# Patient Record
Sex: Female | Born: 1945 | Race: White | Hispanic: No
Health system: Midwestern US, Community
[De-identification: ages and names within clinical notes are randomized; demographics above are authoritative.]

## PROBLEM LIST (undated history)

## (undated) DIAGNOSIS — R0602 Shortness of breath: Secondary | ICD-10-CM

## (undated) DIAGNOSIS — R1031 Right lower quadrant pain: Secondary | ICD-10-CM

## (undated) DIAGNOSIS — N764 Abscess of vulva: Secondary | ICD-10-CM

## (undated) DIAGNOSIS — L920 Granuloma annulare: Secondary | ICD-10-CM

## (undated) DIAGNOSIS — IMO0002 Reserved for concepts with insufficient information to code with codable children: Secondary | ICD-10-CM

## (undated) DIAGNOSIS — G629 Polyneuropathy, unspecified: Secondary | ICD-10-CM

## (undated) DIAGNOSIS — N816 Rectocele: Secondary | ICD-10-CM

## (undated) DIAGNOSIS — E785 Hyperlipidemia, unspecified: Secondary | ICD-10-CM

## (undated) DIAGNOSIS — Z8711 Personal history of peptic ulcer disease: Secondary | ICD-10-CM

## (undated) DIAGNOSIS — Z8719 Personal history of other diseases of the digestive system: Secondary | ICD-10-CM

## (undated) DIAGNOSIS — N951 Menopausal and female climacteric states: Secondary | ICD-10-CM

## (undated) DIAGNOSIS — M545 Low back pain, unspecified: Secondary | ICD-10-CM

## (undated) DIAGNOSIS — R9389 Abnormal findings on diagnostic imaging of other specified body structures: Secondary | ICD-10-CM

## (undated) DIAGNOSIS — F419 Anxiety disorder, unspecified: Secondary | ICD-10-CM

## (undated) DIAGNOSIS — T7840XA Allergy, unspecified, initial encounter: Secondary | ICD-10-CM

## (undated) DIAGNOSIS — J189 Pneumonia, unspecified organism: Principal | ICD-10-CM

## (undated) DIAGNOSIS — Z01818 Encounter for other preprocedural examination: Secondary | ICD-10-CM

## (undated) DIAGNOSIS — K862 Cyst of pancreas: Secondary | ICD-10-CM

## (undated) DIAGNOSIS — Z1231 Encounter for screening mammogram for malignant neoplasm of breast: Principal | ICD-10-CM

## (undated) DIAGNOSIS — R109 Unspecified abdominal pain: Secondary | ICD-10-CM

## (undated) DIAGNOSIS — Z1211 Encounter for screening for malignant neoplasm of colon: Secondary | ICD-10-CM

## (undated) DIAGNOSIS — G959 Disease of spinal cord, unspecified: Secondary | ICD-10-CM

## (undated) DIAGNOSIS — M62838 Other muscle spasm: Secondary | ICD-10-CM

## (undated) DIAGNOSIS — E041 Nontoxic single thyroid nodule: Secondary | ICD-10-CM

## (undated) DIAGNOSIS — R42 Dizziness and giddiness: Secondary | ICD-10-CM

## (undated) DIAGNOSIS — K869 Disease of pancreas, unspecified: Principal | ICD-10-CM

## (undated) DIAGNOSIS — M4802 Spinal stenosis, cervical region: Secondary | ICD-10-CM

## (undated) DIAGNOSIS — R590 Localized enlarged lymph nodes: Secondary | ICD-10-CM

## (undated) DIAGNOSIS — D136 Benign neoplasm of pancreas: Secondary | ICD-10-CM

## (undated) HISTORY — PX: OTHER SURGICAL HISTORY: SHX169

## (undated) HISTORY — DX: Right lower quadrant pain: R10.31

## (undated) HISTORY — DX: Menopausal and female climacteric states: N95.1

## (undated) HISTORY — DX: Personal history of other diseases of the digestive system: Z87.19

## (undated) HISTORY — DX: Abnormal findings on diagnostic imaging of other specified body structures: R93.89

## (undated) HISTORY — DX: Granuloma annulare: L92.0

## (undated) HISTORY — DX: Reserved for concepts with insufficient information to code with codable children: IMO0002

## (undated) HISTORY — DX: Shortness of breath: R06.02

## (undated) HISTORY — PX: FOOT SURGERY: SHX648

## (undated) HISTORY — DX: Personal history of peptic ulcer disease: Z87.11

## (undated) HISTORY — DX: Allergy, unspecified, initial encounter: T78.40XA

## (undated) HISTORY — DX: Hyperlipidemia, unspecified: E78.5

## (undated) HISTORY — DX: Abscess of vulva: N76.4

## (undated) HISTORY — DX: Polyneuropathy, unspecified: G62.9

## (undated) HISTORY — PX: TUBAL LIGATION: SHX77

## (undated) HISTORY — DX: Low back pain: M54.5

## (undated) HISTORY — DX: Anxiety disorder, unspecified: F41.9

## (undated) HISTORY — DX: Rectocele: N81.6

---

## 2007-06-28 ENCOUNTER — Other Ambulatory Visit: Admission: RE | Admit: 2007-06-28 | Discharge: 2007-06-28 | Payer: Self-pay | Admitting: Obstetrics and Gynecology

## 2008-07-18 ENCOUNTER — Other Ambulatory Visit: Admission: RE | Admit: 2008-07-18 | Discharge: 2008-07-18 | Payer: Self-pay | Admitting: Obstetrics and Gynecology

## 2009-10-30 ENCOUNTER — Other Ambulatory Visit: Admission: RE | Admit: 2009-10-30 | Discharge: 2009-10-30 | Payer: Self-pay | Admitting: Obstetrics & Gynecology

## 2010-02-11 ENCOUNTER — Ambulatory Visit
Admission: RE | Admit: 2010-02-11 | Discharge: 2010-02-11 | Payer: Self-pay | Source: Home / Self Care | Attending: Internal Medicine | Admitting: Internal Medicine

## 2010-03-19 ENCOUNTER — Ambulatory Visit (HOSPITAL_COMMUNITY)
Admission: RE | Admit: 2010-03-19 | Discharge: 2010-03-19 | Disposition: A | Discharge status: Home / Self Care | Payer: Medicare Other | Source: Ambulatory Visit | Attending: Internal Medicine | Admitting: Internal Medicine

## 2010-03-19 ENCOUNTER — Encounter (HOSPITAL_BASED_OUTPATIENT_CLINIC_OR_DEPARTMENT_OTHER): Payer: Medicare Other | Admitting: Internal Medicine

## 2010-03-19 DIAGNOSIS — Z860100 Personal history of colon polyps, unspecified: Secondary | ICD-10-CM | POA: Insufficient documentation

## 2010-03-19 DIAGNOSIS — Z1211 Encounter for screening for malignant neoplasm of colon: Secondary | ICD-10-CM

## 2010-03-19 DIAGNOSIS — Z800 Family history of malignant neoplasm of digestive organs: Secondary | ICD-10-CM

## 2010-03-19 DIAGNOSIS — Z09 Encounter for follow-up examination after completed treatment for conditions other than malignant neoplasm: Secondary | ICD-10-CM | POA: Insufficient documentation

## 2010-04-12 NOTE — Op Note (Signed)
  Lydia Lara, Lydia Lara               ACCOUNT NO.:  [id number]  MEDICAL RECORD NO.:  [id number]           PATIENT TYPE:  O  LOCATION:  DAYP                          FACILITY:  [facility]  PHYSICIAN:  Lionel December, M.D.    DATE OF BIRTH:  22-Nov-1945  DATE OF PROCEDURE:  03/19/2010 DATE OF DISCHARGE:                              OPERATIVE REPORT   PROCEDURE:  Colonoscopy.  INDICATION:  Lydia Lara is 65 year old Caucasian female who has history of colonic polyps whose last exam was over 5 years ago in [place].  While the last exam was negative, she has had polyps on prior exams.  Family history is positive for colon carcinoma in a mother who died of metastatic disease at 46.  She was diagnosed possibly a year or two earlier.  Procedures were reviewed with the patient.  Informed consent was obtained.  MEDS FOR CONSCIOUS SEDATION:  Demerol 25 mg IV, Versed 4 mg IV in divided dose.  FINDINGS:  Procedure performed in endoscopy suite.  The patient's vital signs and O2 sat were monitored during the procedure and remained stable.  The patient was placed in left lateral recumbent position and rectal examination performed.  No abnormality noted on external or digital exam.  Pentax videoscope was placed through rectum and advanced under vision into sigmoid colon and beyond.  Preparation was excellent. Scope was passed into cecum which was identified by appendiceal orifice and ileocecal valve.  As the scope was withdrawn, colonic mucosa was carefully examined and was normal throughout.  Rectal mucosa similarly was normal.  Scope was retroflexed to examine anorectal junction which was unremarkable.  Endoscope was then withdrawn.  Withdrawal time was 6 minutes.  The patient tolerated the procedure well.  FINAL DIAGNOSIS:  Normal colonoscopy.  RECOMMENDATIONS: 1. Standard instructions given. 2. She should consider next exam in 5 years from now. 3. Should her abdominal pain recur, she will call  office.    Lionel December, M.D.    NR/MEDQ  D:  03/19/2010  T:  03/19/2010  Job:  [id number]  cc:   Dr. Sherril Croon  Electronically Signed by Lionel December M.D. on 04/12/2010 12:53:25 PM

## 2011-06-04 DIAGNOSIS — D485 Neoplasm of uncertain behavior of skin: Secondary | ICD-10-CM | POA: Diagnosis not present

## 2011-06-04 DIAGNOSIS — L570 Actinic keratosis: Secondary | ICD-10-CM | POA: Diagnosis not present

## 2011-06-04 DIAGNOSIS — Z85828 Personal history of other malignant neoplasm of skin: Secondary | ICD-10-CM | POA: Diagnosis not present

## 2011-06-15 DIAGNOSIS — L98499 Non-pressure chronic ulcer of skin of other sites with unspecified severity: Secondary | ICD-10-CM | POA: Diagnosis not present

## 2011-06-15 DIAGNOSIS — D485 Neoplasm of uncertain behavior of skin: Secondary | ICD-10-CM | POA: Diagnosis not present

## 2011-07-15 DIAGNOSIS — H903 Sensorineural hearing loss, bilateral: Secondary | ICD-10-CM | POA: Diagnosis not present

## 2011-07-16 DIAGNOSIS — E782 Mixed hyperlipidemia: Secondary | ICD-10-CM | POA: Diagnosis not present

## 2011-07-16 DIAGNOSIS — I10 Essential (primary) hypertension: Secondary | ICD-10-CM | POA: Diagnosis not present

## 2011-10-19 DIAGNOSIS — Z8249 Family history of ischemic heart disease and other diseases of the circulatory system: Secondary | ICD-10-CM | POA: Diagnosis not present

## 2011-10-19 DIAGNOSIS — R079 Chest pain, unspecified: Secondary | ICD-10-CM | POA: Diagnosis not present

## 2011-10-19 DIAGNOSIS — Z79899 Other long term (current) drug therapy: Secondary | ICD-10-CM | POA: Diagnosis not present

## 2011-10-19 DIAGNOSIS — R0789 Other chest pain: Secondary | ICD-10-CM | POA: Diagnosis not present

## 2011-10-19 DIAGNOSIS — Z2821 Immunization not carried out because of patient refusal: Secondary | ICD-10-CM | POA: Diagnosis not present

## 2011-10-19 DIAGNOSIS — Z780 Asymptomatic menopausal state: Secondary | ICD-10-CM | POA: Diagnosis not present

## 2011-10-19 DIAGNOSIS — F329 Major depressive disorder, single episode, unspecified: Secondary | ICD-10-CM | POA: Diagnosis not present

## 2011-10-19 DIAGNOSIS — Z809 Family history of malignant neoplasm, unspecified: Secondary | ICD-10-CM | POA: Diagnosis not present

## 2011-10-19 DIAGNOSIS — Z23 Encounter for immunization: Secondary | ICD-10-CM | POA: Diagnosis not present

## 2011-10-19 DIAGNOSIS — E785 Hyperlipidemia, unspecified: Secondary | ICD-10-CM | POA: Diagnosis not present

## 2011-10-20 DIAGNOSIS — Z2821 Immunization not carried out because of patient refusal: Secondary | ICD-10-CM | POA: Diagnosis not present

## 2011-10-20 DIAGNOSIS — F329 Major depressive disorder, single episode, unspecified: Secondary | ICD-10-CM | POA: Diagnosis not present

## 2011-10-20 DIAGNOSIS — E785 Hyperlipidemia, unspecified: Secondary | ICD-10-CM | POA: Diagnosis not present

## 2011-10-20 DIAGNOSIS — R079 Chest pain, unspecified: Secondary | ICD-10-CM | POA: Diagnosis not present

## 2011-10-20 DIAGNOSIS — R0789 Other chest pain: Secondary | ICD-10-CM | POA: Diagnosis not present

## 2011-10-29 DIAGNOSIS — R079 Chest pain, unspecified: Secondary | ICD-10-CM | POA: Diagnosis not present

## 2011-10-29 DIAGNOSIS — Z1231 Encounter for screening mammogram for malignant neoplasm of breast: Secondary | ICD-10-CM | POA: Diagnosis not present

## 2011-11-09 DIAGNOSIS — I209 Angina pectoris, unspecified: Secondary | ICD-10-CM | POA: Diagnosis not present

## 2011-11-17 DIAGNOSIS — Z23 Encounter for immunization: Secondary | ICD-10-CM | POA: Diagnosis not present

## 2011-12-06 ENCOUNTER — Other Ambulatory Visit (HOSPITAL_COMMUNITY)
Admission: RE | Admit: 2011-12-06 | Discharge: 2011-12-06 | Disposition: A | Discharge status: Home / Self Care | Payer: Medicare Other | Source: Ambulatory Visit | Attending: Obstetrics and Gynecology | Admitting: Obstetrics and Gynecology

## 2011-12-06 DIAGNOSIS — Z1151 Encounter for screening for human papillomavirus (HPV): Secondary | ICD-10-CM | POA: Diagnosis not present

## 2011-12-06 DIAGNOSIS — Z1212 Encounter for screening for malignant neoplasm of rectum: Secondary | ICD-10-CM | POA: Diagnosis not present

## 2011-12-06 DIAGNOSIS — Z124 Encounter for screening for malignant neoplasm of cervix: Secondary | ICD-10-CM | POA: Diagnosis not present

## 2012-01-31 DIAGNOSIS — R42 Dizziness and giddiness: Secondary | ICD-10-CM | POA: Diagnosis not present

## 2012-01-31 DIAGNOSIS — I69998 Other sequelae following unspecified cerebrovascular disease: Secondary | ICD-10-CM | POA: Diagnosis not present

## 2012-03-23 DIAGNOSIS — M47817 Spondylosis without myelopathy or radiculopathy, lumbosacral region: Secondary | ICD-10-CM | POA: Diagnosis not present

## 2012-03-23 DIAGNOSIS — M533 Sacrococcygeal disorders, not elsewhere classified: Secondary | ICD-10-CM | POA: Diagnosis not present

## 2012-03-23 DIAGNOSIS — M549 Dorsalgia, unspecified: Secondary | ICD-10-CM | POA: Diagnosis not present

## 2012-06-27 DIAGNOSIS — E782 Mixed hyperlipidemia: Secondary | ICD-10-CM | POA: Diagnosis not present

## 2012-06-27 DIAGNOSIS — I10 Essential (primary) hypertension: Secondary | ICD-10-CM | POA: Diagnosis not present

## 2012-07-31 DIAGNOSIS — Z800 Family history of malignant neoplasm of digestive organs: Secondary | ICD-10-CM | POA: Diagnosis not present

## 2012-08-09 DIAGNOSIS — Z85828 Personal history of other malignant neoplasm of skin: Secondary | ICD-10-CM | POA: Diagnosis not present

## 2012-08-09 DIAGNOSIS — L570 Actinic keratosis: Secondary | ICD-10-CM | POA: Diagnosis not present

## 2012-08-09 DIAGNOSIS — L538 Other specified erythematous conditions: Secondary | ICD-10-CM | POA: Diagnosis not present

## 2012-08-09 DIAGNOSIS — D485 Neoplasm of uncertain behavior of skin: Secondary | ICD-10-CM | POA: Diagnosis not present

## 2012-09-04 DIAGNOSIS — Z1211 Encounter for screening for malignant neoplasm of colon: Secondary | ICD-10-CM | POA: Diagnosis not present

## 2012-09-04 DIAGNOSIS — R42 Dizziness and giddiness: Secondary | ICD-10-CM | POA: Diagnosis not present

## 2012-09-04 DIAGNOSIS — Z800 Family history of malignant neoplasm of digestive organs: Secondary | ICD-10-CM | POA: Diagnosis not present

## 2012-09-04 DIAGNOSIS — J309 Allergic rhinitis, unspecified: Secondary | ICD-10-CM | POA: Diagnosis not present

## 2012-09-04 DIAGNOSIS — E785 Hyperlipidemia, unspecified: Secondary | ICD-10-CM | POA: Diagnosis not present

## 2012-09-04 DIAGNOSIS — F411 Generalized anxiety disorder: Secondary | ICD-10-CM | POA: Diagnosis not present

## 2012-09-04 DIAGNOSIS — Z79899 Other long term (current) drug therapy: Secondary | ICD-10-CM | POA: Diagnosis not present

## 2012-09-04 DIAGNOSIS — I10 Essential (primary) hypertension: Secondary | ICD-10-CM | POA: Diagnosis not present

## 2012-10-30 DIAGNOSIS — Z1231 Encounter for screening mammogram for malignant neoplasm of breast: Secondary | ICD-10-CM | POA: Diagnosis not present

## 2012-11-28 DIAGNOSIS — I10 Essential (primary) hypertension: Secondary | ICD-10-CM | POA: Diagnosis not present

## 2013-03-27 DIAGNOSIS — E782 Mixed hyperlipidemia: Secondary | ICD-10-CM | POA: Diagnosis not present

## 2013-03-27 DIAGNOSIS — I10 Essential (primary) hypertension: Secondary | ICD-10-CM | POA: Diagnosis not present

## 2013-05-08 DIAGNOSIS — M549 Dorsalgia, unspecified: Secondary | ICD-10-CM | POA: Diagnosis not present

## 2013-05-14 ENCOUNTER — Ambulatory Visit (INDEPENDENT_AMBULATORY_CARE_PROVIDER_SITE_OTHER): Payer: Medicare Other | Admitting: Adult Health

## 2013-05-14 ENCOUNTER — Encounter: Payer: Self-pay | Admitting: Adult Health

## 2013-05-14 VITALS — BP 130/84 | Ht 60.0 in | Wt 144.5 lb

## 2013-05-14 DIAGNOSIS — N764 Abscess of vulva: Secondary | ICD-10-CM

## 2013-05-14 HISTORY — DX: Abscess of vulva: N76.4

## 2013-05-14 MED ORDER — HYDROCODONE-ACETAMINOPHEN 5-325 MG PO TABS: 1.0000 | ORAL_TABLET | Freq: Four times a day (QID) | ORAL | Status: DC | PRN

## 2013-05-14 MED ORDER — SULFAMETHOXAZOLE-TMP DS 800-160 MG PO TABS: 1.0000 | ORAL_TABLET | Freq: Two times a day (BID) | ORAL | Status: DC

## 2013-05-14 NOTE — Progress Notes (Signed)
Subjective:     Patient ID: Lydia Lara, female   DOB: May 28, 1945, 68 y.o.   MRN: [id number]  HPI Lydia Lara is a 68 year old white female, in complaining of area on bottom that hurts and has been there almost 3 weeks, has used warm compresses.Thinks it could be hair bump.   Review of Systems See HPI Reviewed past medical,surgical, social and family history. Reviewed medications and allergies.     Objective:   Physical Exam BP 130/84  Ht 5' (1.524 m)  Wt 144 lb 8 oz (65.545 kg)  BMI 28.22 kg/m2   Skin warm and dry.Pelvic: external genitalia: has 2 cm round tender area at base of left labia with open area, where it may have drained,used hurricane spray and made small puncture with 11 blade, drained small amount blood no pus, and has area just below that has dry excoriated material, not as tender, I did rectal exam and these are not communicating lesions.Dr Glo Herring asked to come in to examine.will Rx antibiotic and fore go I&D at this time. Pt denies any history of herpes.  Assessment:     Vulva abscess    Plan:      Rx septra ds 1 bid x 14 days Rx norco 5-325 mg 1 every 6 hour prn pain no refills Use warm compresses Follow up in 2 weeks Review handout on abscess

## 2013-05-14 NOTE — Patient Instructions (Signed)
Abscess An abscess is an infected area that contains a collection of pus and debris.It can occur in almost any part of the body. An abscess is also known as a furuncle or boil. CAUSES  An abscess occurs when tissue gets infected. This can occur from blockage of oil or sweat glands, infection of hair follicles, or a minor injury to the skin. As the body tries to fight the infection, pus collects in the area and creates pressure under the skin. This pressure causes pain. People with weakened immune systems have difficulty fighting infections and get certain abscesses more often.  SYMPTOMS Usually an abscess develops on the skin and becomes a painful mass that is red, warm, and tender. If the abscess forms under the skin, you may feel a moveable soft area under the skin. Some abscesses break open (rupture) on their own, but most will continue to get worse without care. The infection can spread deeper into the body and eventually into the bloodstream, causing you to feel ill.  DIAGNOSIS  Your caregiver will take your medical history and perform a physical exam. A sample of fluid may also be taken from the abscess to determine what is causing your infection. TREATMENT  Your caregiver may prescribe antibiotic medicines to fight the infection. However, taking antibiotics alone usually does not cure an abscess. Your caregiver may need to make a small cut (incision) in the abscess to drain the pus. In some cases, gauze is packed into the abscess to reduce pain and to continue draining the area. HOME CARE INSTRUCTIONS   Only take over-the-counter or prescription medicines for pain, discomfort, or fever as directed by your caregiver.  If you were prescribed antibiotics, take them as directed. Finish them even if you start to feel better.  If gauze is used, follow your caregiver's directions for changing the gauze.  To avoid spreading the infection:  Keep your draining abscess covered with a  bandage.  Wash your hands well.  Do not share personal care items, towels, or whirlpools with others.  Avoid skin contact with others.  Keep your skin and clothes clean around the abscess.  Keep all follow-up appointments as directed by your caregiver. SEEK MEDICAL CARE IF:   You have increased pain, swelling, redness, fluid drainage, or bleeding.  You have muscle aches, chills, or a general ill feeling.  You have a fever. MAKE SURE YOU:   Understand these instructions.  Will watch your condition.  Will get help right away if you are not doing well or get worse. Document Released: 10/14/2004 Document Revised: 07/06/2011 Document Reviewed: 03/19/2011 [facility] Patient Information 2014 [place]. Take septra ds Follow up in 2 weeks Use warm compresses

## 2013-05-21 ENCOUNTER — Telehealth: Payer: Self-pay | Admitting: Adult Health

## 2013-05-28 ENCOUNTER — Ambulatory Visit (INDEPENDENT_AMBULATORY_CARE_PROVIDER_SITE_OTHER): Payer: Medicare Other | Admitting: Adult Health

## 2013-05-28 ENCOUNTER — Encounter: Payer: Self-pay | Admitting: Adult Health

## 2013-05-28 VITALS — BP 118/66 | Ht 60.0 in | Wt 143.0 lb

## 2013-05-28 DIAGNOSIS — N764 Abscess of vulva: Secondary | ICD-10-CM

## 2013-05-28 NOTE — Progress Notes (Signed)
Subjective:     Patient ID: Lydia Lara, female   DOB: 12-20-1945, 68 y.o.   MRN: [id number]  HPI Kobe is a 68 year old white female back in follow up of vulva abscess,syas it is good.  Review of Systems See HPI Reviewed past medical,surgical, social and family history. Reviewed medications and allergies.     Objective:   Physical Exam BP 118/66  Ht 5' (1.524 m)  Wt 143 lb (64.864 kg)  BMI 27.93 kg/m2   Abscess resolved has BB sized thickness,non tender  Assessment:     Resolved vulva abscess    Plan:     Follow up prn Call if feels like coming back will rx

## 2013-05-28 NOTE — Patient Instructions (Signed)
Follow up prn

## 2013-07-02 DIAGNOSIS — Z0000 Encounter for general adult medical examination without abnormal findings: Secondary | ICD-10-CM | POA: Diagnosis not present

## 2013-07-02 DIAGNOSIS — M549 Dorsalgia, unspecified: Secondary | ICD-10-CM | POA: Diagnosis not present

## 2013-07-10 DIAGNOSIS — M549 Dorsalgia, unspecified: Secondary | ICD-10-CM | POA: Diagnosis not present

## 2013-07-10 DIAGNOSIS — Z6828 Body mass index (BMI) 28.0-28.9, adult: Secondary | ICD-10-CM | POA: Diagnosis not present

## 2013-07-18 DIAGNOSIS — R262 Difficulty in walking, not elsewhere classified: Secondary | ICD-10-CM | POA: Diagnosis not present

## 2013-07-18 DIAGNOSIS — M6281 Muscle weakness (generalized): Secondary | ICD-10-CM | POA: Diagnosis not present

## 2013-07-18 DIAGNOSIS — M549 Dorsalgia, unspecified: Secondary | ICD-10-CM | POA: Diagnosis not present

## 2013-07-23 DIAGNOSIS — R262 Difficulty in walking, not elsewhere classified: Secondary | ICD-10-CM | POA: Diagnosis not present

## 2013-07-23 DIAGNOSIS — M549 Dorsalgia, unspecified: Secondary | ICD-10-CM | POA: Diagnosis not present

## 2013-07-23 DIAGNOSIS — M6281 Muscle weakness (generalized): Secondary | ICD-10-CM | POA: Diagnosis not present

## 2013-07-26 DIAGNOSIS — M549 Dorsalgia, unspecified: Secondary | ICD-10-CM | POA: Diagnosis not present

## 2013-07-26 DIAGNOSIS — R262 Difficulty in walking, not elsewhere classified: Secondary | ICD-10-CM | POA: Diagnosis not present

## 2013-07-26 DIAGNOSIS — M6281 Muscle weakness (generalized): Secondary | ICD-10-CM | POA: Diagnosis not present

## 2013-07-30 DIAGNOSIS — M549 Dorsalgia, unspecified: Secondary | ICD-10-CM | POA: Diagnosis not present

## 2013-07-30 DIAGNOSIS — R262 Difficulty in walking, not elsewhere classified: Secondary | ICD-10-CM | POA: Diagnosis not present

## 2013-07-30 DIAGNOSIS — M6281 Muscle weakness (generalized): Secondary | ICD-10-CM | POA: Diagnosis not present

## 2013-08-02 DIAGNOSIS — R262 Difficulty in walking, not elsewhere classified: Secondary | ICD-10-CM | POA: Diagnosis not present

## 2013-08-02 DIAGNOSIS — M549 Dorsalgia, unspecified: Secondary | ICD-10-CM | POA: Diagnosis not present

## 2013-08-02 DIAGNOSIS — M6281 Muscle weakness (generalized): Secondary | ICD-10-CM | POA: Diagnosis not present

## 2013-08-08 DIAGNOSIS — L538 Other specified erythematous conditions: Secondary | ICD-10-CM | POA: Diagnosis not present

## 2013-08-08 DIAGNOSIS — Z85828 Personal history of other malignant neoplasm of skin: Secondary | ICD-10-CM | POA: Diagnosis not present

## 2013-08-08 DIAGNOSIS — L570 Actinic keratosis: Secondary | ICD-10-CM | POA: Diagnosis not present

## 2013-10-01 DIAGNOSIS — M549 Dorsalgia, unspecified: Secondary | ICD-10-CM | POA: Diagnosis not present

## 2013-10-01 DIAGNOSIS — M47817 Spondylosis without myelopathy or radiculopathy, lumbosacral region: Secondary | ICD-10-CM | POA: Diagnosis not present

## 2013-10-01 DIAGNOSIS — H1045 Other chronic allergic conjunctivitis: Secondary | ICD-10-CM | POA: Diagnosis not present

## 2013-10-01 DIAGNOSIS — M5126 Other intervertebral disc displacement, lumbar region: Secondary | ICD-10-CM | POA: Diagnosis not present

## 2013-10-02 DIAGNOSIS — R0602 Shortness of breath: Secondary | ICD-10-CM | POA: Diagnosis not present

## 2013-10-02 DIAGNOSIS — K219 Gastro-esophageal reflux disease without esophagitis: Secondary | ICD-10-CM | POA: Diagnosis not present

## 2013-10-02 DIAGNOSIS — I509 Heart failure, unspecified: Secondary | ICD-10-CM | POA: Diagnosis not present

## 2013-10-02 DIAGNOSIS — G4730 Sleep apnea, unspecified: Secondary | ICD-10-CM | POA: Diagnosis not present

## 2013-10-05 DIAGNOSIS — I517 Cardiomegaly: Secondary | ICD-10-CM | POA: Diagnosis not present

## 2013-10-05 DIAGNOSIS — I509 Heart failure, unspecified: Secondary | ICD-10-CM | POA: Diagnosis not present

## 2013-10-05 DIAGNOSIS — I359 Nonrheumatic aortic valve disorder, unspecified: Secondary | ICD-10-CM | POA: Diagnosis not present

## 2013-11-06 DIAGNOSIS — Z1231 Encounter for screening mammogram for malignant neoplasm of breast: Secondary | ICD-10-CM | POA: Diagnosis not present

## 2013-11-10 DIAGNOSIS — Z23 Encounter for immunization: Secondary | ICD-10-CM | POA: Diagnosis not present

## 2013-11-19 ENCOUNTER — Encounter: Payer: Self-pay | Admitting: Adult Health

## 2013-12-06 ENCOUNTER — Ambulatory Visit (HOSPITAL_COMMUNITY)
Admission: RE | Admit: 2013-12-06 | Discharge: 2013-12-06 | Disposition: A | Discharge status: Home / Self Care | Payer: Medicare Other | Source: Ambulatory Visit | Attending: Adult Health | Admitting: Adult Health

## 2013-12-06 ENCOUNTER — Telehealth: Payer: Self-pay | Admitting: Adult Health

## 2013-12-06 ENCOUNTER — Encounter: Payer: Self-pay | Admitting: Adult Health

## 2013-12-06 ENCOUNTER — Other Ambulatory Visit (HOSPITAL_COMMUNITY)
Admission: RE | Admit: 2013-12-06 | Discharge: 2013-12-06 | Disposition: A | Discharge status: Home / Self Care | Payer: Medicare Other | Source: Ambulatory Visit | Attending: Adult Health | Admitting: Adult Health

## 2013-12-06 ENCOUNTER — Ambulatory Visit (INDEPENDENT_AMBULATORY_CARE_PROVIDER_SITE_OTHER): Payer: Medicare Other | Admitting: Adult Health

## 2013-12-06 VITALS — BP 124/78 | HR 76 | Ht 59.25 in | Wt 147.0 lb

## 2013-12-06 DIAGNOSIS — Z124 Encounter for screening for malignant neoplasm of cervix: Secondary | ICD-10-CM | POA: Diagnosis not present

## 2013-12-06 DIAGNOSIS — Z01419 Encounter for gynecological examination (general) (routine) without abnormal findings: Secondary | ICD-10-CM | POA: Diagnosis not present

## 2013-12-06 DIAGNOSIS — Z1151 Encounter for screening for human papillomavirus (HPV): Secondary | ICD-10-CM | POA: Diagnosis not present

## 2013-12-06 DIAGNOSIS — R635 Abnormal weight gain: Secondary | ICD-10-CM | POA: Diagnosis not present

## 2013-12-06 DIAGNOSIS — R0602 Shortness of breath: Secondary | ICD-10-CM | POA: Diagnosis not present

## 2013-12-06 DIAGNOSIS — N951 Menopausal and female climacteric states: Secondary | ICD-10-CM

## 2013-12-06 DIAGNOSIS — R1031 Right lower quadrant pain: Secondary | ICD-10-CM

## 2013-12-06 HISTORY — DX: Menopausal and female climacteric states: N95.1

## 2013-12-06 HISTORY — DX: Right lower quadrant pain: R10.31

## 2013-12-06 HISTORY — DX: Shortness of breath: R06.02

## 2013-12-06 LAB — TSH: TSH: 2.382 u[IU]/mL (ref 0.350–4.500)

## 2013-12-06 LAB — HEMOCCULT GUIAC POC 1CARD (OFFICE): Fecal Occult Blood, POC: NEGATIVE

## 2013-12-06 NOTE — Progress Notes (Signed)
Patient ID: Lydia Lara, female   DOB: 08-25-1945, 68 y.o.   MRN: [id number] History of Present Illness: Lydia Lara is a 68 year old white female, married in for pap and physical.She complains of vaginal dryness,short of breath with exertion for about 6 months, had negative CHF W/U with Dr Zada Girt and labs.She also has a dry cough and hoarseness after eating and weight gain and sluggishness.Densies any trouble swallowing.Has pain RLQ, had MRI on back has arthritis.She still has hot flashes.She is retired and has been traveling.She has gotten flu shot and pneumonia shot and shingles shot.   Current Medications, Allergies, Past Medical History, Past Surgical History, Family History and Social History were reviewed in [employer] record.     Review of Systems: Patient denies any headaches, blurred vision,  chest pain, problems with bowel movements, urination, or intercourse. No mood swings on Zoloft.See HPI for positives.    Physical Exam:BP 124/78 mmHg  Pulse 76  Ht 4' 11.25" (1.505 m)  Wt 147 lb (66.679 kg)  BMI 29.44 kg/m2 General:  Well developed, well nourished, no acute distress Skin:  Warm and dry Neck:  Midline trachea, normal thyroid, no carotid bruits Lungs; Clear to auscultation bilaterally Breast:  No dominant palpable mass, retraction, or nipple discharge Cardiovascular: Regular rate and rhythm Abdomen:  Soft, non tender, no hepatosplenomegaly Pelvic:  External genitalia is normal in appearance for age, no lesions.  The vagina has los of rugae and moisture and is pale in color. The cervix is atrophic and stenotic, pap with HPV performed.  Uterus is felt to be normal size, shape, and contour.  No  adnexal masses, has RLQ tenderness noted. Rectal: Good sphincter tone, no polyps, or hemorrhoids felt.  Hemoccult negative.+ rectocele Extremities:  No swelling or varicosities noted Psych:  No mood changes,alert and cooperative,seems happy   Impression: Well  woman exam with pap RLQ pain Short of breath with exertion Weight gain Vaginal dryness     Plan: Get chest xray Check TSH Pelvic US in 2 weeks and see me Try luvena for vaginal dryness Try zantac has Rx from PCP, if does not help see GI, may need EGD Physical in 2 years Mammogram yearly  Colonoscopy every 2 years per GI

## 2013-12-06 NOTE — Telephone Encounter (Signed)
Pt aware of chest xray

## 2013-12-06 NOTE — Patient Instructions (Signed)
Try luvena Try zantac,nexium,prilosec OTC to try Physical in 2 years return in 2 weeks for [place]  Get chest xray  Calorie Counting for Weight Loss Calories are energy you get from the things you eat and drink. Your body uses this energy to keep you going throughout the day. The number of calories you eat affects your weight. When you eat more calories than your body needs, your body stores the extra calories as fat. When you eat fewer calories than your body needs, your body burns fat to get the energy it needs. Calorie counting means keeping track of how many calories you eat and drink each day. If you make sure to eat fewer calories than your body needs, you should lose weight. In order for calorie counting to work, you will need to eat the number of calories that are right for you in a day to lose a healthy amount of weight per week. A healthy amount of weight to lose per week is usually 1-2 lb (0.5-0.9 kg). A dietitian can determine how many calories you need in a day and give you suggestions on how to reach your calorie goal.  WHAT IS MY MY PLAN? My goal is to have __________ calories per day.  If I have this many calories per day, I should lose around __________ pounds per week. WHAT DO I NEED TO KNOW ABOUT CALORIE COUNTING? In order to meet your daily calorie goal, you will need to:  Find out how many calories are in each food you would like to eat. Try to do this before you eat.  Decide how much of the food you can eat.  Write down what you ate and how many calories it had. Doing this is called keeping a food log. WHERE DO I FIND CALORIE INFORMATION? The number of calories in a food can be found on a Nutrition Facts label. Note that all the information on a label is based on a specific serving of the food. If a food does not have a Nutrition Facts label, try to look up the calories online or ask your dietitian for help. HOW DO I DECIDE HOW MUCH TO EAT? To decide how much of the food you can  eat, you will need to consider both the number of calories in one serving and the size of one serving. This information can be found on the Nutrition Facts label. If a food does not have a Nutrition Facts label, look up the information online or ask your dietitian for help. Remember that calories are listed per serving. If you choose to have more than one serving of a food, you will have to multiply the calories per serving by the amount of servings you plan to eat. For example, the label on a package of bread might say that a serving size is 1 slice and that there are 90 calories in a serving. If you eat 1 slice, you will have eaten 90 calories. If you eat 2 slices, you will have eaten 180 calories. HOW DO I KEEP A FOOD LOG? After each meal, record the following information in your food log:  What you ate.  How much of it you ate.  How many calories it had.  Then, add up your calories. Keep your food log near you, such as in a small notebook in your pocket. Another option is to use a mobile app or website. Some programs will calculate calories for you and show you how many calories you have  left each time you add an item to the log. WHAT ARE SOME CALORIE COUNTING TIPS?  Use your calories on foods and drinks that will fill you up and not leave you hungry. Some examples of this include foods like nuts and nut butters, vegetables, lean proteins, and high-fiber foods (more than 5 g fiber per serving).  Eat nutritious foods and avoid empty calories. Empty calories are calories you get from foods or beverages that do not have many nutrients, such as candy and soda. It is better to have a nutritious high-calorie food (such as an avocado) than a food with few nutrients (such as a bag of chips).  Know how many calories are in the foods you eat most often. This way, you do not have to look up how many calories they have each time you eat them.  Look out for foods that may seem like low-calorie foods but  are really high-calorie foods, such as baked goods, soda, and fat-free candy.  Pay attention to calories in drinks. Drinks such as sodas, specialty coffee drinks, alcohol, and juices have a lot of calories yet do not fill you up. Choose low-calorie drinks like water and diet drinks.  Focus your calorie counting efforts on higher calorie items. Logging the calories in a garden salad that contains only vegetables is less important than calculating the calories in a milk shake.  Find a way of tracking calories that works for you. Get creative. Most people who are successful find ways to keep track of how much they eat in a day, even if they do not count every calorie. WHAT ARE SOME PORTION CONTROL TIPS?  Know how many calories are in a serving. This will help you know how many servings of a certain food you can have.  Use a measuring cup to measure serving sizes. This is helpful when you start out. With time, you will be able to estimate serving sizes for some foods.  Take some time to put servings of different foods on your favorite plates, bowls, and cups so you know what a serving looks like.  Try not to eat straight from a bag or box. Doing this can lead to overeating. Put the amount you would like to eat in a cup or on a plate to make sure you are eating the right portion.  Use smaller plates, glasses, and bowls to prevent overeating. This is a quick and easy way to practice portion control. If your plate is smaller, less food can fit on it.  Try not to multitask while eating, such as watching TV or using your computer. If it is time to eat, sit down at a table and enjoy your food. Doing this will help you to start recognizing when you are full. It will also make you more aware of what and how much you are eating. HOW CAN I CALORIE COUNT WHEN EATING OUT?  Ask for smaller portion sizes or child-sized portions.  Consider sharing an entree and sides instead of getting your own entree.  If  you get your own entree, eat only half. Ask for a box at the beginning of your meal and put the rest of your entree in it so you are not tempted to eat it.  Look for the calories on the menu. If calories are listed, choose the lower calorie options.  Choose dishes that include vegetables, fruits, whole grains, low-fat dairy products, and lean protein. Focusing on smart food choices from each of the 5  food groups can help you stay on track at restaurants.  Choose items that are boiled, broiled, grilled, or steamed.  Choose water, milk, unsweetened iced tea, or other drinks without added sugars. If you want an alcoholic beverage, choose a lower calorie option. For example, a regular margarita can have up to 700 calories and a glass of wine has around 150.  Stay away from items that are buttered, battered, fried, or served with cream sauce. Items labeled "crispy" are usually fried, unless stated otherwise.  Ask for dressings, sauces, and syrups on the side. These are usually very high in calories, so do not eat much of them.  Watch out for salads. Many people think salads are a healthy option, but this is often not the case. Many salads come with bacon, fried chicken, lots of cheese, fried chips, and dressing. All of these items have a lot of calories. If you want a salad, choose a garden salad and ask for grilled meats or steak. Ask for the dressing on the side, or ask for olive oil and vinegar or lemon to use as dressing.  Estimate how many servings of a food you are given. For example, a serving of cooked rice is  cup or about the size of half a tennis ball or one cupcake wrapper. Knowing serving sizes will help you be aware of how much food you are eating at restaurants. The list below tells you how big or small some common portion sizes are based on everyday objects.  1 oz--4 stacked dice.  3 oz--1 deck of cards.  1 tsp--1 dice.  1 Tbsp-- a Ping-Pong ball.  2 Tbsp--1 Ping-Pong  ball.   cup--1 tennis ball or 1 cupcake wrapper.  1 cup--1 baseball. Document Released: 01/04/2005 Document Revised: 05/21/2013 Document Reviewed: 11/09/2012 [facility] Patient Information 2015 [place], [place]. This information is not intended to replace advice given to you by your health care provider. Make sure you discuss any questions you have with your health care provider. Pelvic Pain Female pelvic pain can be caused by many different things and start from a variety of places. Pelvic pain refers to pain that is located in the lower half of the abdomen and between your hips. The pain may occur over a short period of time (acute) or may be reoccurring (chronic). The cause of pelvic pain may be related to disorders affecting the female reproductive organs (gynecologic), but it may also be related to the bladder, kidney stones, an intestinal complication, or muscle or skeletal problems. Getting help right away for pelvic pain is important, especially if there has been severe, sharp, or a sudden onset of unusual pain. It is also important to get help right away because some types of pelvic pain can be life threatening.  CAUSES  Below are only some of the causes of pelvic pain. The causes of pelvic pain can be in one of several categories.   Gynecologic.  Pelvic inflammatory disease.  Sexually transmitted infection.  Ovarian cyst or a twisted ovarian ligament (ovarian torsion).  Uterine lining that grows outside the uterus (endometriosis).  Fibroids, cysts, or tumors.  Ovulation.  Pregnancy.  Pregnancy that occurs outside the uterus (ectopic pregnancy).  Miscarriage.  Labor.  Abruption of the placenta or ruptured uterus.  Infection.  Uterine infection (endometritis).  Bladder infection.  Diverticulitis.  Miscarriage related to a uterine infection (septic abortion).  Bladder.  Inflammation of the bladder (cystitis).  Kidney  stone(s).  Gastrointestinal.  Constipation.  Diverticulitis.  Neurologic.  Trauma.  Feeling pelvic pain because of mental or emotional causes (psychosomatic).  Cancers of the bowel or pelvis. EVALUATION  Your caregiver will want to take a careful history of your concerns. This includes recent changes in your health, a careful gynecologic history of your periods (menses), and a sexual history. Obtaining your family history and medical history is also important. Your caregiver may suggest a pelvic exam. A pelvic exam will help identify the location and severity of the pain. It also helps in the evaluation of which organ system may be involved. In order to identify the cause of the pelvic pain and be properly treated, your caregiver may order tests. These tests may include:   A pregnancy test.  Pelvic ultrasonography.  An X-ray exam of the abdomen.  A urinalysis or evaluation of vaginal discharge.  Blood tests. HOME CARE INSTRUCTIONS   Only take over-the-counter or prescription medicines for pain, discomfort, or fever as directed by your caregiver.   Rest as directed by your caregiver.   Eat a balanced diet.   Drink enough fluids to make your urine clear or pale yellow, or as directed.   Avoid sexual intercourse if it causes pain.   Apply warm or cold compresses to the lower abdomen depending on which one helps the pain.   Avoid stressful situations.   Keep a journal of your pelvic pain. Write down when it started, where the pain is located, and if there are things that seem to be associated with the pain, such as food or your menstrual cycle.  Follow up with your caregiver as directed.  SEEK MEDICAL CARE IF:  Your medicine does not help your pain.  You have abnormal vaginal discharge. SEEK IMMEDIATE MEDICAL CARE IF:   You have heavy bleeding from the vagina.   Your pelvic pain increases.   You feel light-headed or faint.   You have chills.   You  have pain with urination or blood in your urine.   You have uncontrolled diarrhea or vomiting.   You have a fever or persistent symptoms for more than 3 days.  You have a fever and your symptoms suddenly get worse.   You are being physically or sexually abused.  MAKE SURE YOU:  Understand these instructions.  Will watch your condition.  Will get help if you are not doing well or get worse. Document Released: 12/02/2003 Document Revised: 05/21/2013 Document Reviewed: 04/26/2011 [facility] Patient Information 2015 [place], [place]. This information is not intended to replace advice given to you by your health care provider. Make sure you discuss any questions you have with your health care provider.

## 2013-12-07 ENCOUNTER — Telehealth: Payer: Self-pay | Admitting: Adult Health

## 2013-12-07 LAB — CYTOLOGY - PAP

## 2013-12-07 NOTE — Telephone Encounter (Signed)
Pt aware TSH normal

## 2013-12-20 ENCOUNTER — Other Ambulatory Visit: Payer: Self-pay | Admitting: Adult Health

## 2013-12-20 ENCOUNTER — Ambulatory Visit (INDEPENDENT_AMBULATORY_CARE_PROVIDER_SITE_OTHER): Payer: Medicare Other | Admitting: Adult Health

## 2013-12-20 ENCOUNTER — Encounter: Payer: Self-pay | Admitting: Adult Health

## 2013-12-20 ENCOUNTER — Ambulatory Visit (INDEPENDENT_AMBULATORY_CARE_PROVIDER_SITE_OTHER): Payer: Medicare Other

## 2013-12-20 VITALS — BP 124/86 | Ht 59.75 in | Wt 143.0 lb

## 2013-12-20 DIAGNOSIS — R938 Abnormal findings on diagnostic imaging of other specified body structures: Secondary | ICD-10-CM | POA: Diagnosis not present

## 2013-12-20 DIAGNOSIS — R1031 Right lower quadrant pain: Secondary | ICD-10-CM | POA: Diagnosis not present

## 2013-12-20 DIAGNOSIS — R9389 Abnormal findings on diagnostic imaging of other specified body structures: Secondary | ICD-10-CM

## 2013-12-20 HISTORY — DX: Abnormal findings on diagnostic imaging of other specified body structures: R93.89

## 2013-12-20 NOTE — Patient Instructions (Signed)
Return in 6 days for endo biopsy

## 2013-12-20 NOTE — Progress Notes (Signed)
Subjective:     Patient ID: Lydia Lara, female   DOB: 24-Feb-1945, 68 y.o.   MRN: [id number]  HPI Lydia Lara is a 68 year old white female, married in for [place] for RLQ pain.She has lost 4 lbs since last visit,by portion control.  Review of Systems See HPI Reviewed past medical,surgical, social and family history. Reviewed medications and allergies.     Objective:   Physical Exam BP 124/86 mmHg  Ht 4' 11.75" (1.518 m)  Wt 143 lb (64.864 kg)  BMI 28.15 kg/m2Reviewed [place] with pt and discussed with Dr Elonda Husky.   Uterus 4.8 x 3.7 x 2.8 cm, anteverted  Endometrium 5.4 mm, symmetrical,   Right ovary 1.3 x 0.8 x 0.6 cm,   Left ovary 1.2 x 1.0 x 0.9 cm,   No free fluid or adnexal masses noted within the pelvis  Technician Comments:  Anteverted uterus, ENDOM-5.61mm, bilateral ovaries appear WNL, no free fluid noted  Will get endo biopsy with Dr Elonda Husky to assess endometrial thickness.  Assessment:     Thickened endometrium     Plan:    Review handout on endo biopsy Return in 6 days for endo biopsy with Dr Elonda Husky

## 2013-12-26 ENCOUNTER — Other Ambulatory Visit: Payer: Self-pay | Admitting: Obstetrics & Gynecology

## 2013-12-26 ENCOUNTER — Encounter: Payer: Medicare Other | Admitting: Obstetrics & Gynecology

## 2013-12-26 ENCOUNTER — Ambulatory Visit (INDEPENDENT_AMBULATORY_CARE_PROVIDER_SITE_OTHER): Payer: Medicare Other | Admitting: Obstetrics & Gynecology

## 2013-12-26 ENCOUNTER — Encounter: Payer: Self-pay | Admitting: Obstetrics & Gynecology

## 2013-12-26 DIAGNOSIS — N840 Polyp of corpus uteri: Secondary | ICD-10-CM | POA: Diagnosis not present

## 2013-12-26 DIAGNOSIS — R9389 Abnormal findings on diagnostic imaging of other specified body structures: Secondary | ICD-10-CM

## 2013-12-26 NOTE — Progress Notes (Signed)
Patient ID: Lydia Lara, female   DOB: Sep 18, 1945, 68 y.o.   MRN: [id number]  Dg Chest 2 View  12/06/2013   CLINICAL DATA:  Shortness of breath for 6 months with exertion  EXAM: CHEST  2 VIEW  COMPARISON:  Chest x-ray of 10/19/2011  FINDINGS: No active infiltrate or effusion is seen. Mediastinal and hilar contours are unremarkable. The heart is borderline enlarged. No bony abnormality is seen.  IMPRESSION: No active cardiopulmonary disease.   Electronically Signed   By: Ivar Drape M.D.   On: 12/06/2013 15:18   US Transvaginal Non-ob  12/21/2013   GYNECOLOGIC SONOGRAM   [name] is a 68 y.o. G2P2 for a pelvic sonogram for RLQ pain.  Uterus                      4.8 x 3.7 x 2.8 cm, anteverted  Endometrium          5.4 mm, symmetrical,   Right ovary             1.3 x 0.8 x 0.6 cm,   Left ovary                1.2 x 1.0 x 0.9 cm,   No free fluid or adnexal masses noted within the pelvis  Technician Comments:  Anteverted uterus, ENDOM-5.32mm, bilateral ovaries appear WNL, no free  fluid noted    Lazarus Gowda 12/20/2013 10:55 AM  Clinical Impression and recommendations:  I have reviewed the sonogram results above, combined with the patient's  current clinical course, below are my impressions and any appropriate  recommendations for management based on the sonographic findings.  Thickened endometrium in a post menopausal female Normal ovaries No etiology for the RLQ pain noted  EURE,LUTHER H 12/21/2013 5:37 PM        Endometrial Biopsy Procedure Note  Pre-operative Diagnosis: Thickened endometriu  Post-operative Diagnosis: same  Indications: thickened endometrium  Procedure Details   Urine pregnancy test was not done.  The risks (including infection, bleeding, pain, and uterine perforation) and benefits of the procedure were explained to the patient and Verbal informed consent was obtained.  Antibiotic prophylaxis against endocarditis was not indicated.   The patient was placed in the dorsal  lithotomy position.  Bimanual exam showed the uterus to be in the neutral position.  A Graves' speculum inserted in the vagina, and the cervix prepped with povidone iodine.  Endocervical curettage with a Kevorkian curette was not performed.   A sharp tenaculum was applied to the anterior lip of the cervix for stabilization.  A sterile uterine sound was used to sound the uterus to a depth of 5.5cm.  A Pipelle endometrial aspirator was used to sample the endometrium.  Sample was sent for pathologic examination.  Condition: Stable  Complications: None  Plan:  The patient was advised to call for any fever or for prolonged or severe pain or bleeding. She was advised to use OTC ibuprofen as needed for mild to moderate pain. She was advised to avoid vaginal intercourse for 48 hours or until the bleeding has completely stopped.  Attending Physician Documentation: I was present for or participated in the entire procedure, including opening and closing.

## 2013-12-26 NOTE — Addendum Note (Signed)
Addended by: Doyne Keel on: 12/26/2013 04:27 PM   Modules accepted: Orders

## 2014-01-08 ENCOUNTER — Telehealth: Payer: Self-pay | Admitting: Adult Health

## 2014-01-08 NOTE — Telephone Encounter (Signed)
Pt aware endo biopsy showed endometrial polyp, no malignancy, call with nay bleeding or will repeat US in 6 months, put in recall for June

## 2014-02-06 DIAGNOSIS — Z79899 Other long term (current) drug therapy: Secondary | ICD-10-CM | POA: Diagnosis not present

## 2014-02-06 DIAGNOSIS — L570 Actinic keratosis: Secondary | ICD-10-CM | POA: Diagnosis not present

## 2014-02-06 DIAGNOSIS — M542 Cervicalgia: Secondary | ICD-10-CM | POA: Diagnosis not present

## 2014-02-06 DIAGNOSIS — R200 Anesthesia of skin: Secondary | ICD-10-CM | POA: Diagnosis not present

## 2014-02-06 DIAGNOSIS — R6884 Jaw pain: Secondary | ICD-10-CM | POA: Diagnosis not present

## 2014-02-06 DIAGNOSIS — L920 Granuloma annulare: Secondary | ICD-10-CM | POA: Diagnosis not present

## 2014-02-06 DIAGNOSIS — Z885 Allergy status to narcotic agent status: Secondary | ICD-10-CM | POA: Diagnosis not present

## 2014-02-06 DIAGNOSIS — R0789 Other chest pain: Secondary | ICD-10-CM | POA: Diagnosis not present

## 2014-02-06 DIAGNOSIS — Z800 Family history of malignant neoplasm of digestive organs: Secondary | ICD-10-CM | POA: Diagnosis not present

## 2014-02-06 DIAGNOSIS — E785 Hyperlipidemia, unspecified: Secondary | ICD-10-CM | POA: Diagnosis not present

## 2014-02-06 DIAGNOSIS — M549 Dorsalgia, unspecified: Secondary | ICD-10-CM | POA: Diagnosis not present

## 2014-02-06 DIAGNOSIS — I444 Left anterior fascicular block: Secondary | ICD-10-CM | POA: Diagnosis not present

## 2014-02-06 DIAGNOSIS — Z85828 Personal history of other malignant neoplasm of skin: Secondary | ICD-10-CM | POA: Diagnosis not present

## 2014-02-06 DIAGNOSIS — Z8249 Family history of ischemic heart disease and other diseases of the circulatory system: Secondary | ICD-10-CM | POA: Diagnosis not present

## 2014-02-06 DIAGNOSIS — R079 Chest pain, unspecified: Secondary | ICD-10-CM | POA: Diagnosis not present

## 2014-02-06 DIAGNOSIS — I10 Essential (primary) hypertension: Secondary | ICD-10-CM | POA: Diagnosis not present

## 2014-02-06 DIAGNOSIS — M25512 Pain in left shoulder: Secondary | ICD-10-CM | POA: Diagnosis not present

## 2014-02-06 DIAGNOSIS — Z780 Asymptomatic menopausal state: Secondary | ICD-10-CM | POA: Diagnosis not present

## 2014-02-06 DIAGNOSIS — F329 Major depressive disorder, single episode, unspecified: Secondary | ICD-10-CM | POA: Diagnosis not present

## 2014-02-07 DIAGNOSIS — Z780 Asymptomatic menopausal state: Secondary | ICD-10-CM | POA: Diagnosis not present

## 2014-02-07 DIAGNOSIS — R0789 Other chest pain: Secondary | ICD-10-CM | POA: Diagnosis not present

## 2014-02-07 DIAGNOSIS — F329 Major depressive disorder, single episode, unspecified: Secondary | ICD-10-CM | POA: Diagnosis not present

## 2014-02-07 DIAGNOSIS — R079 Chest pain, unspecified: Secondary | ICD-10-CM | POA: Diagnosis not present

## 2014-02-07 DIAGNOSIS — E785 Hyperlipidemia, unspecified: Secondary | ICD-10-CM | POA: Diagnosis not present

## 2014-02-13 DIAGNOSIS — R079 Chest pain, unspecified: Secondary | ICD-10-CM | POA: Diagnosis not present

## 2014-02-14 DIAGNOSIS — R0789 Other chest pain: Secondary | ICD-10-CM | POA: Diagnosis not present

## 2014-02-18 DIAGNOSIS — R079 Chest pain, unspecified: Secondary | ICD-10-CM | POA: Diagnosis not present

## 2014-03-06 ENCOUNTER — Telehealth: Payer: Self-pay | Admitting: Adult Health

## 2014-03-06 NOTE — Telephone Encounter (Signed)
Still has pain in RLQ make appt

## 2014-03-08 ENCOUNTER — Other Ambulatory Visit: Payer: Self-pay | Admitting: *Deleted

## 2014-03-08 ENCOUNTER — Ambulatory Visit (INDEPENDENT_AMBULATORY_CARE_PROVIDER_SITE_OTHER): Payer: Medicare Other | Admitting: Obstetrics & Gynecology

## 2014-03-08 ENCOUNTER — Encounter: Payer: Self-pay | Admitting: Obstetrics & Gynecology

## 2014-03-08 VITALS — BP 118/70 | Wt 146.0 lb

## 2014-03-08 DIAGNOSIS — R1031 Right lower quadrant pain: Secondary | ICD-10-CM | POA: Diagnosis not present

## 2014-03-08 MED ORDER — LIDOCAINE 5 % EX PTCH: 1.0000 | MEDICATED_PATCH | CUTANEOUS | Status: DC

## 2014-03-08 NOTE — Progress Notes (Signed)
Patient ID: Lydia Lara, female   DOB: [birth year], 69 y.o.   MRN: [id number]  Pt examined and found to have abdominal wall pain in RLQ +Carnett's sign probably some injury to rectus sheath insertion into pubis Will undergo injection for both diagnostic and therapeutic purposes  Trigger Point Injection   Pre-operative diagnosis: myofascial pain, RLQ in area of rectus pubis interface radiating to the right  Post-operative diagnosis: myofascial pain  After risks and benefits were explained including bleeding, infection, worsening of the pain, damage to the area being injected, weakness, allergic reaction to medications, vascular injection, and nerve damage, signed consent was obtained.  All questions were answered.    The area of the trigger point was identified and the skin prepped three times with alcohol and the alcohol allowed to dry.  Next, a 25 gauge 1 inch needle was placed in the area of the trigger point.  Once reproduction of the pain was elicited and negative aspiration confirmed, the trigger point was injected and the needle removed.    The patient did tolerate the procedure well and there were not complications.    Medication used: 10 cc 0.5% marcaine plain;     Trigger points injected: 2    Trigger point(s) location(s):  right   Pt with significant relief after injection performed

## 2014-03-12 DIAGNOSIS — M25561 Pain in right knee: Secondary | ICD-10-CM | POA: Diagnosis not present

## 2014-03-19 NOTE — Progress Notes (Signed)
This encounter was created in error - please disregard.

## 2014-07-15 DIAGNOSIS — Z0000 Encounter for general adult medical examination without abnormal findings: Secondary | ICD-10-CM | POA: Diagnosis not present

## 2014-07-15 DIAGNOSIS — Z1389 Encounter for screening for other disorder: Secondary | ICD-10-CM | POA: Diagnosis not present

## 2014-07-15 DIAGNOSIS — R0602 Shortness of breath: Secondary | ICD-10-CM | POA: Diagnosis not present

## 2014-07-15 DIAGNOSIS — I10 Essential (primary) hypertension: Secondary | ICD-10-CM | POA: Diagnosis not present

## 2014-07-15 DIAGNOSIS — J449 Chronic obstructive pulmonary disease, unspecified: Secondary | ICD-10-CM | POA: Diagnosis not present

## 2014-07-16 DIAGNOSIS — Z85828 Personal history of other malignant neoplasm of skin: Secondary | ICD-10-CM | POA: Diagnosis not present

## 2014-07-16 DIAGNOSIS — L26 Exfoliative dermatitis: Secondary | ICD-10-CM | POA: Diagnosis not present

## 2014-07-19 DIAGNOSIS — J449 Chronic obstructive pulmonary disease, unspecified: Secondary | ICD-10-CM | POA: Diagnosis not present

## 2014-07-30 DIAGNOSIS — M1712 Unilateral primary osteoarthritis, left knee: Secondary | ICD-10-CM | POA: Diagnosis not present

## 2014-08-01 DIAGNOSIS — E780 Pure hypercholesterolemia: Secondary | ICD-10-CM | POA: Diagnosis not present

## 2014-08-01 DIAGNOSIS — Z780 Asymptomatic menopausal state: Secondary | ICD-10-CM | POA: Diagnosis not present

## 2014-08-01 DIAGNOSIS — Z79899 Other long term (current) drug therapy: Secondary | ICD-10-CM | POA: Diagnosis not present

## 2014-08-01 DIAGNOSIS — M85851 Other specified disorders of bone density and structure, right thigh: Secondary | ICD-10-CM | POA: Diagnosis not present

## 2014-08-01 DIAGNOSIS — M549 Dorsalgia, unspecified: Secondary | ICD-10-CM | POA: Diagnosis not present

## 2014-08-01 DIAGNOSIS — M8589 Other specified disorders of bone density and structure, multiple sites: Secondary | ICD-10-CM | POA: Diagnosis not present

## 2014-08-01 DIAGNOSIS — M1990 Unspecified osteoarthritis, unspecified site: Secondary | ICD-10-CM | POA: Diagnosis not present

## 2014-08-01 DIAGNOSIS — M85852 Other specified disorders of bone density and structure, left thigh: Secondary | ICD-10-CM | POA: Diagnosis not present

## 2014-08-01 DIAGNOSIS — Z7982 Long term (current) use of aspirin: Secondary | ICD-10-CM | POA: Diagnosis not present

## 2014-08-16 DIAGNOSIS — M25562 Pain in left knee: Secondary | ICD-10-CM | POA: Diagnosis not present

## 2014-08-20 DIAGNOSIS — M25562 Pain in left knee: Secondary | ICD-10-CM | POA: Diagnosis not present

## 2014-08-20 DIAGNOSIS — Z9889 Other specified postprocedural states: Secondary | ICD-10-CM | POA: Diagnosis not present

## 2014-08-20 DIAGNOSIS — M1711 Unilateral primary osteoarthritis, right knee: Secondary | ICD-10-CM | POA: Diagnosis not present

## 2014-09-27 ENCOUNTER — Other Ambulatory Visit: Payer: Self-pay | Admitting: Obstetrics and Gynecology

## 2014-09-27 DIAGNOSIS — R9389 Abnormal findings on diagnostic imaging of other specified body structures: Secondary | ICD-10-CM

## 2014-09-30 ENCOUNTER — Encounter: Payer: Self-pay | Admitting: Obstetrics & Gynecology

## 2014-09-30 ENCOUNTER — Ambulatory Visit (INDEPENDENT_AMBULATORY_CARE_PROVIDER_SITE_OTHER): Payer: Medicare Other | Admitting: Obstetrics & Gynecology

## 2014-09-30 ENCOUNTER — Ambulatory Visit (INDEPENDENT_AMBULATORY_CARE_PROVIDER_SITE_OTHER): Payer: Medicare Other

## 2014-09-30 VITALS — BP 110/80 | HR 80 | Wt 143.0 lb

## 2014-09-30 DIAGNOSIS — R938 Abnormal findings on diagnostic imaging of other specified body structures: Secondary | ICD-10-CM | POA: Diagnosis not present

## 2014-09-30 DIAGNOSIS — N840 Polyp of corpus uteri: Secondary | ICD-10-CM

## 2014-09-30 DIAGNOSIS — R9389 Abnormal findings on diagnostic imaging of other specified body structures: Secondary | ICD-10-CM

## 2014-09-30 NOTE — Progress Notes (Signed)
PELVIC US TA/TV: heterogenous anteverted uterus,EEC 3.59mm,normal ov's bilat (mobile),no free fluid seen,no pain during ultrasound

## 2014-09-30 NOTE — Progress Notes (Signed)
Patient ID: Lydia Lara, female   DOB: 1945-06-26, 69 y.o.   MRN: [id number] US Transvaginal Non-ob  09/30/2014   GYNECOLOGIC SONOGRAM   Lydia Lara is a 69 y.o. postmenopausal G2P2 women is here for a pelvic  sonogram for hx of thickened endometrium.  Uterus                      5.9 x 4 x 3.3 cm, heterogenous anteverted  uterus  Endometrium          3.5 mm, symmetrical, wnl  Right ovary             2.1 x 2.2 x 1.0 cm, wnl  Left ovary                2.1 x 2 x 1 cm, wnl    Technician Comments:  PELVIC US TA/TV: heterogenous anteverted uterus,EEC 3.25mm,normal ov's  bilat (mobile),no free fluid seen,no pain during ultrasound     [employer] 09/30/2014 3:42 PM  Clinical Impression and recommendations:  I have reviewed the sonogram results above, combined with the patient's  current clinical course, below are my impressions and any appropriate  recommendations for management based on the sonographic findings.  Normal uterus Thin endometrium, post menopausal Normal ovaries, post menopausal   Lydia Lara 09/30/2014 4:00 PM     US Pelvis Complete  09/30/2014   GYNECOLOGIC SONOGRAM   Lydia Lara is a 69 y.o. postmenopausal G2P2 women is here for a pelvic  sonogram for hx of thickened endometrium.  Uterus                      5.9 x 4 x 3.3 cm, heterogenous anteverted  uterus  Endometrium          3.5 mm, symmetrical, wnl  Right ovary             2.1 x 2.2 x 1.0 cm, wnl  Left ovary                2.1 x 2 x 1 cm, wnl    Technician Comments:  PELVIC US TA/TV: heterogenous anteverted uterus,EEC 3.1mm,normal ov's  bilat (mobile),no free fluid seen,no pain during ultrasound     [employer] 09/30/2014 3:42 PM  Clinical Impression and recommendations:  I have reviewed the sonogram results above, combined with the patient's  current clinical course, below are my impressions and any appropriate  recommendations for management based on the sonographic findings.  Normal uterus Thin endometrium, post menopausal Normal ovaries,  post menopausal   Lydia Lara 09/30/2014 4:00 PM     Follow up from her biopsy and sonogram which revealed polypoid changes, benign No bleeding RLQ resolved after injection  Follow up 1 year No follow up needed for this issue unless you have bleeding or other problems

## 2014-10-15 DIAGNOSIS — I10 Essential (primary) hypertension: Secondary | ICD-10-CM | POA: Diagnosis not present

## 2014-10-15 DIAGNOSIS — K210 Gastro-esophageal reflux disease with esophagitis: Secondary | ICD-10-CM | POA: Diagnosis not present

## 2014-10-15 DIAGNOSIS — J8489 Other specified interstitial pulmonary diseases: Secondary | ICD-10-CM | POA: Diagnosis not present

## 2014-10-27 DIAGNOSIS — Z23 Encounter for immunization: Secondary | ICD-10-CM | POA: Diagnosis not present

## 2014-11-08 DIAGNOSIS — R0609 Other forms of dyspnea: Secondary | ICD-10-CM | POA: Diagnosis not present

## 2014-11-11 DIAGNOSIS — Z1231 Encounter for screening mammogram for malignant neoplasm of breast: Secondary | ICD-10-CM | POA: Diagnosis not present

## 2014-11-12 DIAGNOSIS — M25561 Pain in right knee: Secondary | ICD-10-CM | POA: Diagnosis not present

## 2014-11-19 DIAGNOSIS — M179 Osteoarthritis of knee, unspecified: Secondary | ICD-10-CM | POA: Diagnosis not present

## 2014-11-19 DIAGNOSIS — M7121 Synovial cyst of popliteal space [Baker], right knee: Secondary | ICD-10-CM | POA: Diagnosis not present

## 2014-11-19 DIAGNOSIS — M25561 Pain in right knee: Secondary | ICD-10-CM | POA: Diagnosis not present

## 2014-11-19 DIAGNOSIS — M23321 Other meniscus derangements, posterior horn of medial meniscus, right knee: Secondary | ICD-10-CM | POA: Diagnosis not present

## 2014-11-21 DIAGNOSIS — Z7982 Long term (current) use of aspirin: Secondary | ICD-10-CM | POA: Diagnosis not present

## 2014-11-21 DIAGNOSIS — Z79899 Other long term (current) drug therapy: Secondary | ICD-10-CM | POA: Diagnosis not present

## 2014-11-21 DIAGNOSIS — I2510 Atherosclerotic heart disease of native coronary artery without angina pectoris: Secondary | ICD-10-CM | POA: Diagnosis not present

## 2014-11-21 DIAGNOSIS — R0609 Other forms of dyspnea: Secondary | ICD-10-CM | POA: Diagnosis not present

## 2014-12-06 DIAGNOSIS — M25562 Pain in left knee: Secondary | ICD-10-CM | POA: Diagnosis not present

## 2014-12-17 DIAGNOSIS — H04123 Dry eye syndrome of bilateral lacrimal glands: Secondary | ICD-10-CM | POA: Diagnosis not present

## 2014-12-20 DIAGNOSIS — M25562 Pain in left knee: Secondary | ICD-10-CM | POA: Diagnosis not present

## 2015-01-07 DIAGNOSIS — L26 Exfoliative dermatitis: Secondary | ICD-10-CM | POA: Diagnosis not present

## 2015-01-07 DIAGNOSIS — Z85828 Personal history of other malignant neoplasm of skin: Secondary | ICD-10-CM | POA: Diagnosis not present

## 2015-01-07 DIAGNOSIS — L570 Actinic keratosis: Secondary | ICD-10-CM | POA: Diagnosis not present

## 2015-01-15 DIAGNOSIS — M545 Low back pain: Secondary | ICD-10-CM | POA: Diagnosis not present

## 2015-01-15 DIAGNOSIS — I10 Essential (primary) hypertension: Secondary | ICD-10-CM | POA: Diagnosis not present

## 2015-01-15 DIAGNOSIS — R0609 Other forms of dyspnea: Secondary | ICD-10-CM | POA: Diagnosis not present

## 2015-01-15 DIAGNOSIS — E784 Other hyperlipidemia: Secondary | ICD-10-CM | POA: Diagnosis not present

## 2015-01-15 DIAGNOSIS — R002 Palpitations: Secondary | ICD-10-CM | POA: Diagnosis not present

## 2015-01-15 DIAGNOSIS — Z131 Encounter for screening for diabetes mellitus: Secondary | ICD-10-CM | POA: Diagnosis not present

## 2015-01-30 DIAGNOSIS — Z800 Family history of malignant neoplasm of digestive organs: Secondary | ICD-10-CM | POA: Diagnosis not present

## 2015-04-03 ENCOUNTER — Encounter (INDEPENDENT_AMBULATORY_CARE_PROVIDER_SITE_OTHER): Payer: Self-pay | Admitting: *Deleted

## 2015-04-09 ENCOUNTER — Telehealth (INDEPENDENT_AMBULATORY_CARE_PROVIDER_SITE_OTHER): Payer: Self-pay | Admitting: *Deleted

## 2015-04-09 ENCOUNTER — Encounter (INDEPENDENT_AMBULATORY_CARE_PROVIDER_SITE_OTHER): Payer: Self-pay

## 2015-04-09 NOTE — Telephone Encounter (Signed)
Patient's last TCS by you was 03/2010, your recommendation was to repeat in 5 yrs, 03/2015, contacted patient to schedule, upon triaging her she told me she had TCS by Dr Britta Mccreedy in 2014 (scanned under procedure tab) because she got a notice from Medicare that she needed one and her PCP (hasanaj) set it up with him -- when is she due for repeat TCS

## 2015-04-10 NOTE — Telephone Encounter (Signed)
She does not have Lynch syndrome that I know of. Therefore she could wait 5 years from her last exam. If she is having symptoms then we will change our plan

## 2015-04-10 NOTE — Telephone Encounter (Signed)
TCS put on recall for 08/2017, left message for patient

## 2015-05-20 DIAGNOSIS — I10 Essential (primary) hypertension: Secondary | ICD-10-CM | POA: Diagnosis not present

## 2015-05-20 DIAGNOSIS — M545 Low back pain: Secondary | ICD-10-CM | POA: Diagnosis not present

## 2015-05-20 DIAGNOSIS — N3080 Other cystitis without hematuria: Secondary | ICD-10-CM | POA: Diagnosis not present

## 2015-05-20 DIAGNOSIS — R0602 Shortness of breath: Secondary | ICD-10-CM | POA: Diagnosis not present

## 2015-06-02 DIAGNOSIS — R109 Unspecified abdominal pain: Secondary | ICD-10-CM | POA: Diagnosis not present

## 2015-06-02 DIAGNOSIS — R1030 Lower abdominal pain, unspecified: Secondary | ICD-10-CM | POA: Diagnosis not present

## 2015-06-02 DIAGNOSIS — M545 Low back pain: Secondary | ICD-10-CM | POA: Diagnosis not present

## 2015-06-02 DIAGNOSIS — N814 Uterovaginal prolapse, unspecified: Secondary | ICD-10-CM | POA: Diagnosis not present

## 2015-06-02 DIAGNOSIS — K862 Cyst of pancreas: Secondary | ICD-10-CM | POA: Diagnosis not present

## 2015-06-09 ENCOUNTER — Encounter: Payer: Self-pay | Admitting: Adult Health

## 2015-06-09 ENCOUNTER — Ambulatory Visit (INDEPENDENT_AMBULATORY_CARE_PROVIDER_SITE_OTHER): Payer: Medicare Other | Admitting: Adult Health

## 2015-06-09 VITALS — BP 122/78 | HR 94 | Ht 60.0 in | Wt 147.5 lb

## 2015-06-09 DIAGNOSIS — M545 Low back pain, unspecified: Secondary | ICD-10-CM

## 2015-06-09 DIAGNOSIS — N816 Rectocele: Secondary | ICD-10-CM

## 2015-06-09 DIAGNOSIS — N8110 Cystocele, unspecified: Secondary | ICD-10-CM | POA: Diagnosis not present

## 2015-06-09 DIAGNOSIS — N8111 Cystocele, midline: Secondary | ICD-10-CM | POA: Insufficient documentation

## 2015-06-09 DIAGNOSIS — IMO0002 Reserved for concepts with insufficient information to code with codable children: Secondary | ICD-10-CM

## 2015-06-09 HISTORY — DX: Rectocele: N81.6

## 2015-06-09 HISTORY — DX: Reserved for concepts with insufficient information to code with codable children: IMO0002

## 2015-06-09 HISTORY — DX: Low back pain, unspecified: M54.50

## 2015-06-09 NOTE — Patient Instructions (Signed)
Follow up in 1 week for pessary fitting

## 2015-06-09 NOTE — Progress Notes (Signed)
Subjective:     Patient ID: Lydia Lara, female   DOB: [birth year], 70 y.o.   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  Review of Systems  +back pain, ?bladder and uterine prolapse  Reviewed past medical,surgical, social and family history. Reviewed medications and allergies.     Objective:   Physical Exam BP 122/78 mmHg  Pulse 94  Ht 5' (1.524 m)  Wt 147 lb 8 oz (66.906 kg)  BMI 28.81 kg/m2   Skin warm and dry.Pelvic: external genitalia is normal in appearance no lesions, vagina: has decreased color, moisture and rugae, urethra has no lesions or masses noted, cervix:smooth and atrophic, uterus: normal size, shape and contour, non tender, no masses felt,mild relaxation, adnexa: no masses or tenderness noted. Bladder is non tender and no masses felt, +cystocele, and +rectocele on rectal exam has food sphincter tone, no polyps felt. Discussed trying pessary to see if helps, will make appt with Dr Elonda Husky for next week.Also dicussed that has nerves from back to pelvic area. Face time 15 minutes with 50 % counseling about anatomy and pessary.  Assessment:    Back pain, low Cystocele Rectocele     Plan:      Return in 1 week for pessary fitting with Dr Elonda Husky Rest with feet up Can use motrin or tylenol Review Medical explainer #4

## 2015-06-17 ENCOUNTER — Ambulatory Visit (INDEPENDENT_AMBULATORY_CARE_PROVIDER_SITE_OTHER): Payer: Medicare Other | Admitting: Obstetrics & Gynecology

## 2015-06-17 ENCOUNTER — Encounter: Payer: Self-pay | Admitting: Obstetrics & Gynecology

## 2015-06-17 VITALS — BP 130/80 | HR 76 | Wt 148.0 lb

## 2015-06-17 DIAGNOSIS — N8110 Cystocele, unspecified: Secondary | ICD-10-CM | POA: Diagnosis not present

## 2015-06-17 DIAGNOSIS — IMO0002 Reserved for concepts with insufficient information to code with codable children: Secondary | ICD-10-CM

## 2015-06-17 DIAGNOSIS — N816 Rectocele: Secondary | ICD-10-CM | POA: Diagnosis not present

## 2015-06-17 NOTE — Progress Notes (Signed)
Patient ID: [name], female   DOB: 1945-03-10, 70 y.o.   MRN: [id number]  See previous weeks note:   Subjective:     Patient ID: Lydia Lara, female   DOB: [birth year], 70 y.o.   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  Review of Systems  +back pain, ?bladder and uterine prolapse  Reviewed past medical,surgical, social and family history. Reviewed medications and allergies.     Objective:   Physical Exam BP 130/80 mmHg  Pulse 76  Wt 148 lb (67.132 kg)   Skin warm and dry.Pelvic: external genitalia is normal in appearance no lesions, vagina: has decreased color, moisture and rugae, urethra has no lesions or masses noted, cervix:smooth and atrophic, uterus: normal size, shape and contour, non tender, no masses felt,mild relaxation, adnexa: no masses or tenderness noted. Bladder is non tender and no masses felt, +cystocele, and +rectocele on rectal exam has food sphincter tone, no polyps felt. Discussed trying pessary to see if helps, will make appt with Dr Elonda Husky for next week.Also dicussed that has nerves from back to pelvic area. Face time 15 minutes with 50 % counseling about anatomy and pessary.  Assessment:    Back pain, low Cystocele Rectocele     Plan:      Return in 1 week for pessary fitting with Dr Elonda Husky Rest with feet up Can use motrin or tylenol Review Medical explainer #4      Chief Complaint  Patient presents with  . pessary fitting    Blood pressure 130/80, pulse 76, weight 148 lb (67.132 kg).  Lydia Lara presents today for pessary fitting related to her cystocoele and rectocoele,  Grade I-II, mild  to moderate  Her back symptoms are disproportionate to her objective amount of vaginal relaxation and prolapse .   She is fit for a Milex ring with support #2,   She reports no vaginal discharge or vaginal bleeding.  Exam reveals no undue vaginal mucosal pressure of breakdown, no discharge and no vaginal bleeding.  The pessary is placed   Lydia Lara will be sen back in 1 week for continued follow up.  Florian Buff, MD   06/17/2015 12:07 PM

## 2015-06-19 DIAGNOSIS — R978 Other abnormal tumor markers: Secondary | ICD-10-CM | POA: Diagnosis not present

## 2015-06-19 DIAGNOSIS — K862 Cyst of pancreas: Secondary | ICD-10-CM | POA: Diagnosis not present

## 2015-06-24 ENCOUNTER — Ambulatory Visit (INDEPENDENT_AMBULATORY_CARE_PROVIDER_SITE_OTHER): Payer: Medicare Other | Admitting: Obstetrics & Gynecology

## 2015-06-24 ENCOUNTER — Encounter: Payer: Self-pay | Admitting: Obstetrics & Gynecology

## 2015-06-24 VITALS — BP 120/80 | HR 92 | Wt 149.0 lb

## 2015-06-24 DIAGNOSIS — N816 Rectocele: Secondary | ICD-10-CM | POA: Diagnosis not present

## 2015-06-24 DIAGNOSIS — N8110 Cystocele, unspecified: Secondary | ICD-10-CM

## 2015-06-24 DIAGNOSIS — IMO0002 Reserved for concepts with insufficient information to code with codable children: Secondary | ICD-10-CM

## 2015-06-24 NOTE — Progress Notes (Signed)
Patient ID: Lydia Lara, female   DOB: [birth year], 70 y.o.   MRN: [id number] Chief Complaint  Patient presents with  . Follow-up    Has pessary    Blood pressure 120/80, pulse 92, weight 149 lb (67.586 kg).  Lydia Lara presents today for routine follow up related to her pessary.   She uses a Milex ring with support #2, placed last week here for 1st follow up No complaints with it She reports no vaginal discharge or vaginal bleeding.  Exam reveals no undue vaginal mucosal pressure of breakdown, no discharge and no vaginal bleeding.  The pessary is removed, cleaned and replaced without difficulty.    As expected her lumbosacral pain is not improved We discussed options and I recommended Dr Bethann Goo in [place] for evlauation and treatment  Lydia Lara will be sen back in 3 months for continued follow up.  Florian Buff, MD  06/24/2015 11:58 AM

## 2015-07-03 DIAGNOSIS — M9903 Segmental and somatic dysfunction of lumbar region: Secondary | ICD-10-CM | POA: Diagnosis not present

## 2015-07-03 DIAGNOSIS — S338XXA Sprain of other parts of lumbar spine and pelvis, initial encounter: Secondary | ICD-10-CM | POA: Diagnosis not present

## 2015-07-07 DIAGNOSIS — S338XXA Sprain of other parts of lumbar spine and pelvis, initial encounter: Secondary | ICD-10-CM | POA: Diagnosis not present

## 2015-07-07 DIAGNOSIS — M9903 Segmental and somatic dysfunction of lumbar region: Secondary | ICD-10-CM | POA: Diagnosis not present

## 2015-07-10 DIAGNOSIS — M47816 Spondylosis without myelopathy or radiculopathy, lumbar region: Secondary | ICD-10-CM | POA: Diagnosis not present

## 2015-07-10 DIAGNOSIS — M9903 Segmental and somatic dysfunction of lumbar region: Secondary | ICD-10-CM | POA: Diagnosis not present

## 2015-07-10 DIAGNOSIS — S338XXA Sprain of other parts of lumbar spine and pelvis, initial encounter: Secondary | ICD-10-CM | POA: Diagnosis not present

## 2015-07-14 DIAGNOSIS — M47816 Spondylosis without myelopathy or radiculopathy, lumbar region: Secondary | ICD-10-CM | POA: Diagnosis not present

## 2015-07-14 DIAGNOSIS — S338XXA Sprain of other parts of lumbar spine and pelvis, initial encounter: Secondary | ICD-10-CM | POA: Diagnosis not present

## 2015-07-14 DIAGNOSIS — M9903 Segmental and somatic dysfunction of lumbar region: Secondary | ICD-10-CM | POA: Diagnosis not present

## 2015-07-21 DIAGNOSIS — S338XXA Sprain of other parts of lumbar spine and pelvis, initial encounter: Secondary | ICD-10-CM | POA: Diagnosis not present

## 2015-07-21 DIAGNOSIS — M9903 Segmental and somatic dysfunction of lumbar region: Secondary | ICD-10-CM | POA: Diagnosis not present

## 2015-07-21 DIAGNOSIS — M47816 Spondylosis without myelopathy or radiculopathy, lumbar region: Secondary | ICD-10-CM | POA: Diagnosis not present

## 2015-07-24 DIAGNOSIS — Z7982 Long term (current) use of aspirin: Secondary | ICD-10-CM | POA: Diagnosis not present

## 2015-07-24 DIAGNOSIS — I2510 Atherosclerotic heart disease of native coronary artery without angina pectoris: Secondary | ICD-10-CM | POA: Diagnosis not present

## 2015-07-24 DIAGNOSIS — I2589 Other forms of chronic ischemic heart disease: Secondary | ICD-10-CM | POA: Diagnosis not present

## 2015-07-24 DIAGNOSIS — K869 Disease of pancreas, unspecified: Secondary | ICD-10-CM | POA: Diagnosis not present

## 2015-07-24 DIAGNOSIS — K862 Cyst of pancreas: Secondary | ICD-10-CM | POA: Diagnosis not present

## 2015-07-24 NOTE — Unmapped (Signed)
Formatting of this note might be different from the original.  Patient taking po fluids well.  No distress.  Husband at bedside.      Electronically signed by: Angelica Ran, RN  07/24/15 1344  Electronically signed by Angelica Ran, RN at 07/24/2015  1:44 PM EDT

## 2015-08-26 DIAGNOSIS — M810 Age-related osteoporosis without current pathological fracture: Secondary | ICD-10-CM | POA: Diagnosis not present

## 2015-08-26 DIAGNOSIS — R1319 Other dysphagia: Secondary | ICD-10-CM | POA: Diagnosis not present

## 2015-08-26 DIAGNOSIS — Z79899 Other long term (current) drug therapy: Secondary | ICD-10-CM | POA: Diagnosis not present

## 2015-08-26 DIAGNOSIS — F3289 Other specified depressive episodes: Secondary | ICD-10-CM | POA: Diagnosis not present

## 2015-08-26 DIAGNOSIS — I10 Essential (primary) hypertension: Secondary | ICD-10-CM | POA: Diagnosis not present

## 2015-08-26 DIAGNOSIS — K210 Gastro-esophageal reflux disease with esophagitis: Secondary | ICD-10-CM | POA: Diagnosis not present

## 2015-08-26 DIAGNOSIS — Z1389 Encounter for screening for other disorder: Secondary | ICD-10-CM | POA: Diagnosis not present

## 2015-08-26 DIAGNOSIS — Z0000 Encounter for general adult medical examination without abnormal findings: Secondary | ICD-10-CM | POA: Diagnosis not present

## 2015-09-09 DIAGNOSIS — Z23 Encounter for immunization: Secondary | ICD-10-CM | POA: Diagnosis not present

## 2015-09-17 DIAGNOSIS — M79672 Pain in left foot: Secondary | ICD-10-CM | POA: Diagnosis not present

## 2015-09-17 DIAGNOSIS — M792 Neuralgia and neuritis, unspecified: Secondary | ICD-10-CM | POA: Diagnosis not present

## 2015-09-17 DIAGNOSIS — G5790 Unspecified mononeuropathy of unspecified lower limb: Secondary | ICD-10-CM | POA: Diagnosis not present

## 2015-09-17 DIAGNOSIS — M79671 Pain in right foot: Secondary | ICD-10-CM | POA: Diagnosis not present

## 2015-09-25 ENCOUNTER — Encounter: Payer: Self-pay | Admitting: Obstetrics & Gynecology

## 2015-09-25 ENCOUNTER — Ambulatory Visit (INDEPENDENT_AMBULATORY_CARE_PROVIDER_SITE_OTHER): Payer: Medicare Other | Admitting: Obstetrics & Gynecology

## 2015-09-25 VITALS — BP 120/80 | HR 80 | Wt 149.0 lb

## 2015-09-25 DIAGNOSIS — N816 Rectocele: Secondary | ICD-10-CM | POA: Diagnosis not present

## 2015-09-25 DIAGNOSIS — N8110 Cystocele, unspecified: Secondary | ICD-10-CM

## 2015-09-25 DIAGNOSIS — IMO0002 Reserved for concepts with insufficient information to code with codable children: Secondary | ICD-10-CM

## 2015-09-25 NOTE — Progress Notes (Signed)
Chief Complaint  Patient presents with  . 3 month follow-up    clean / check pessary    Blood pressure 120/80, pulse 80, weight 149 lb (67.6 kg).  Lydia Lara presents today for routine follow up related to her pessary.   She uses a milex ring with support #2 She reports no vaginal discharge or vaginal bleeding.  Exam reveals no undue vaginal mucosal pressure of breakdown, no discharge and no vaginal bleeding.  The pessary is removed, cleaned and replaced without difficulty.    Lydia Lara will be sen back in 4 months for continued follow up.  Florian Buff, MD  09/25/2015 11:00 AM

## 2015-10-23 DIAGNOSIS — H00014 Hordeolum externum left upper eyelid: Secondary | ICD-10-CM | POA: Diagnosis not present

## 2015-11-13 DIAGNOSIS — R1310 Dysphagia, unspecified: Secondary | ICD-10-CM | POA: Diagnosis not present

## 2015-11-13 DIAGNOSIS — R928 Other abnormal and inconclusive findings on diagnostic imaging of breast: Secondary | ICD-10-CM | POA: Diagnosis not present

## 2015-11-13 DIAGNOSIS — K219 Gastro-esophageal reflux disease without esophagitis: Secondary | ICD-10-CM | POA: Diagnosis not present

## 2015-11-13 DIAGNOSIS — Z1231 Encounter for screening mammogram for malignant neoplasm of breast: Secondary | ICD-10-CM | POA: Diagnosis not present

## 2015-11-21 DIAGNOSIS — M79671 Pain in right foot: Secondary | ICD-10-CM | POA: Diagnosis not present

## 2015-11-25 DIAGNOSIS — M9943 Connective tissue stenosis of neural canal of lumbar region: Secondary | ICD-10-CM | POA: Diagnosis not present

## 2015-11-25 DIAGNOSIS — M79604 Pain in right leg: Secondary | ICD-10-CM | POA: Diagnosis not present

## 2015-11-25 DIAGNOSIS — M5126 Other intervertebral disc displacement, lumbar region: Secondary | ICD-10-CM | POA: Diagnosis not present

## 2015-11-25 DIAGNOSIS — M5136 Other intervertebral disc degeneration, lumbar region: Secondary | ICD-10-CM | POA: Diagnosis not present

## 2015-11-25 DIAGNOSIS — M47816 Spondylosis without myelopathy or radiculopathy, lumbar region: Secondary | ICD-10-CM | POA: Diagnosis not present

## 2015-11-25 DIAGNOSIS — M47817 Spondylosis without myelopathy or radiculopathy, lumbosacral region: Secondary | ICD-10-CM | POA: Diagnosis not present

## 2015-11-26 DIAGNOSIS — N6489 Other specified disorders of breast: Secondary | ICD-10-CM | POA: Diagnosis not present

## 2015-11-26 DIAGNOSIS — R922 Inconclusive mammogram: Secondary | ICD-10-CM | POA: Diagnosis not present

## 2015-11-26 DIAGNOSIS — R928 Other abnormal and inconclusive findings on diagnostic imaging of breast: Secondary | ICD-10-CM | POA: Diagnosis not present

## 2015-12-05 DIAGNOSIS — M79672 Pain in left foot: Secondary | ICD-10-CM | POA: Diagnosis not present

## 2015-12-05 DIAGNOSIS — M810 Age-related osteoporosis without current pathological fracture: Secondary | ICD-10-CM | POA: Diagnosis not present

## 2015-12-05 DIAGNOSIS — M79671 Pain in right foot: Secondary | ICD-10-CM | POA: Diagnosis not present

## 2015-12-05 DIAGNOSIS — F3289 Other specified depressive episodes: Secondary | ICD-10-CM | POA: Diagnosis not present

## 2015-12-05 DIAGNOSIS — R1319 Other dysphagia: Secondary | ICD-10-CM | POA: Diagnosis not present

## 2015-12-05 DIAGNOSIS — I10 Essential (primary) hypertension: Secondary | ICD-10-CM | POA: Diagnosis not present

## 2015-12-05 DIAGNOSIS — K210 Gastro-esophageal reflux disease with esophagitis: Secondary | ICD-10-CM | POA: Diagnosis not present

## 2016-01-07 DIAGNOSIS — Z85828 Personal history of other malignant neoplasm of skin: Secondary | ICD-10-CM | POA: Diagnosis not present

## 2016-01-07 DIAGNOSIS — L920 Granuloma annulare: Secondary | ICD-10-CM | POA: Diagnosis not present

## 2016-01-09 DIAGNOSIS — M25561 Pain in right knee: Secondary | ICD-10-CM | POA: Diagnosis not present

## 2016-01-26 ENCOUNTER — Encounter: Payer: Self-pay | Admitting: Obstetrics & Gynecology

## 2016-01-26 ENCOUNTER — Ambulatory Visit (INDEPENDENT_AMBULATORY_CARE_PROVIDER_SITE_OTHER): Payer: Medicare Other | Admitting: Obstetrics & Gynecology

## 2016-01-26 VITALS — BP 111/64 | HR 74 | Wt 147.0 lb

## 2016-01-26 DIAGNOSIS — N8110 Cystocele, unspecified: Secondary | ICD-10-CM

## 2016-01-26 DIAGNOSIS — N816 Rectocele: Secondary | ICD-10-CM

## 2016-01-26 NOTE — Progress Notes (Signed)
Chief Complaint  Patient presents with  . Pessary Check    clean    Blood pressure 111/64, pulse 74, weight 147 lb (66.7 kg).  Lydia Lara presents today for routine follow up related to her pessary.   She uses a milex ring with support #2 She reports no vaginal discharge or vaginal bleeding.  Exam reveals no undue vaginal mucosal pressure of breakdown, no discharge and no vaginal bleeding.  The pessary is removed, cleaned and replaced without difficulty.    Lydia Lara will be sen back in 4 months for continued follow up.  Florian Buff, MD  01/26/2016 10:15 AM

## 2016-02-17 DIAGNOSIS — R0609 Other forms of dyspnea: Secondary | ICD-10-CM | POA: Diagnosis not present

## 2016-02-17 DIAGNOSIS — R0602 Shortness of breath: Secondary | ICD-10-CM | POA: Diagnosis not present

## 2016-02-18 DIAGNOSIS — R0602 Shortness of breath: Secondary | ICD-10-CM | POA: Diagnosis not present

## 2016-02-18 DIAGNOSIS — R0609 Other forms of dyspnea: Secondary | ICD-10-CM | POA: Diagnosis not present

## 2016-03-11 DIAGNOSIS — R5383 Other fatigue: Secondary | ICD-10-CM | POA: Diagnosis not present

## 2016-03-11 DIAGNOSIS — K210 Gastro-esophageal reflux disease with esophagitis: Secondary | ICD-10-CM | POA: Diagnosis not present

## 2016-03-11 DIAGNOSIS — M810 Age-related osteoporosis without current pathological fracture: Secondary | ICD-10-CM | POA: Diagnosis not present

## 2016-03-11 DIAGNOSIS — Z79899 Other long term (current) drug therapy: Secondary | ICD-10-CM | POA: Diagnosis not present

## 2016-03-11 DIAGNOSIS — F3289 Other specified depressive episodes: Secondary | ICD-10-CM | POA: Diagnosis not present

## 2016-03-11 DIAGNOSIS — I10 Essential (primary) hypertension: Secondary | ICD-10-CM | POA: Diagnosis not present

## 2016-04-01 DIAGNOSIS — H6123 Impacted cerumen, bilateral: Secondary | ICD-10-CM | POA: Diagnosis not present

## 2016-04-01 DIAGNOSIS — J449 Chronic obstructive pulmonary disease, unspecified: Secondary | ICD-10-CM | POA: Diagnosis not present

## 2016-04-06 ENCOUNTER — Ambulatory Visit (INDEPENDENT_AMBULATORY_CARE_PROVIDER_SITE_OTHER): Payer: Medicare Other | Admitting: Obstetrics & Gynecology

## 2016-04-06 ENCOUNTER — Encounter: Payer: Self-pay | Admitting: Obstetrics & Gynecology

## 2016-04-06 VITALS — BP 118/78 | HR 74 | Wt 138.0 lb

## 2016-04-06 DIAGNOSIS — Z1389 Encounter for screening for other disorder: Secondary | ICD-10-CM | POA: Diagnosis not present

## 2016-04-06 DIAGNOSIS — R350 Frequency of micturition: Secondary | ICD-10-CM

## 2016-04-06 DIAGNOSIS — N816 Rectocele: Secondary | ICD-10-CM | POA: Diagnosis not present

## 2016-04-06 DIAGNOSIS — Z4689 Encounter for fitting and adjustment of other specified devices: Secondary | ICD-10-CM

## 2016-04-06 LAB — POCT URINALYSIS DIPSTICK
Glucose, UA: NEGATIVE
KETONES UA: NEGATIVE
Leukocytes, UA: NEGATIVE
Nitrite, UA: NEGATIVE
Protein, UA: NEGATIVE
RBC UA: NEGATIVE

## 2016-04-06 NOTE — Addendum Note (Signed)
Addended by: Gaylyn Rong A on: 04/06/2016 03:04 PM   Modules accepted: Orders

## 2016-04-06 NOTE — Addendum Note (Signed)
Addended by: Gaylyn Rong A on: 04/06/2016 04:13 PM   Modules accepted: Orders

## 2016-04-06 NOTE — Progress Notes (Signed)
Chief Complaint  Patient presents with  . Back Pain    ?UTI    Blood pressure 118/78, pulse 74, weight 138 lb (62.6 kg).  Artemis Koller presents today for routine follow up related to her pessary.   She uses a milex ring with support #2 She reports no vaginal discharge or vaginal bleeding.  Exam reveals no undue vaginal mucosal pressure of breakdown, no discharge and no vaginal bleeding.  The pessary is removed, cleaned and replaced without difficulty.    She has been having some recent issues about a month with lethargy, fuzzy headed, some issues with memory.  Nikole Swartzentruber will be sen back in 4 months for continued follow up.  Florian Buff, MD  04/06/2016 2:46 PM

## 2016-04-08 LAB — URINE CULTURE

## 2016-04-14 DIAGNOSIS — S83241A Other tear of medial meniscus, current injury, right knee, initial encounter: Secondary | ICD-10-CM | POA: Diagnosis not present

## 2016-04-14 DIAGNOSIS — S83231A Complex tear of medial meniscus, current injury, right knee, initial encounter: Secondary | ICD-10-CM | POA: Diagnosis not present

## 2016-04-14 DIAGNOSIS — G8918 Other acute postprocedural pain: Secondary | ICD-10-CM | POA: Diagnosis not present

## 2016-04-14 DIAGNOSIS — Y999 Unspecified external cause status: Secondary | ICD-10-CM | POA: Diagnosis not present

## 2016-04-14 DIAGNOSIS — M13161 Monoarthritis, not elsewhere classified, right knee: Secondary | ICD-10-CM | POA: Diagnosis not present

## 2016-04-14 DIAGNOSIS — M94261 Chondromalacia, right knee: Secondary | ICD-10-CM | POA: Diagnosis not present

## 2016-04-14 DIAGNOSIS — S83281A Other tear of lateral meniscus, current injury, right knee, initial encounter: Secondary | ICD-10-CM | POA: Diagnosis not present

## 2016-04-20 DIAGNOSIS — M25661 Stiffness of right knee, not elsewhere classified: Secondary | ICD-10-CM | POA: Diagnosis not present

## 2016-04-20 DIAGNOSIS — M25561 Pain in right knee: Secondary | ICD-10-CM | POA: Diagnosis not present

## 2016-04-20 DIAGNOSIS — R2689 Other abnormalities of gait and mobility: Secondary | ICD-10-CM | POA: Diagnosis not present

## 2016-04-23 DIAGNOSIS — M25561 Pain in right knee: Secondary | ICD-10-CM | POA: Diagnosis not present

## 2016-04-27 DIAGNOSIS — R2689 Other abnormalities of gait and mobility: Secondary | ICD-10-CM | POA: Diagnosis not present

## 2016-04-27 DIAGNOSIS — M25561 Pain in right knee: Secondary | ICD-10-CM | POA: Diagnosis not present

## 2016-04-27 DIAGNOSIS — M25661 Stiffness of right knee, not elsewhere classified: Secondary | ICD-10-CM | POA: Diagnosis not present

## 2016-04-29 DIAGNOSIS — M25661 Stiffness of right knee, not elsewhere classified: Secondary | ICD-10-CM | POA: Diagnosis not present

## 2016-04-29 DIAGNOSIS — M25561 Pain in right knee: Secondary | ICD-10-CM | POA: Diagnosis not present

## 2016-04-29 DIAGNOSIS — R2689 Other abnormalities of gait and mobility: Secondary | ICD-10-CM | POA: Diagnosis not present

## 2016-05-05 DIAGNOSIS — M25561 Pain in right knee: Secondary | ICD-10-CM | POA: Diagnosis not present

## 2016-05-05 DIAGNOSIS — M25661 Stiffness of right knee, not elsewhere classified: Secondary | ICD-10-CM | POA: Diagnosis not present

## 2016-05-05 DIAGNOSIS — R2689 Other abnormalities of gait and mobility: Secondary | ICD-10-CM | POA: Diagnosis not present

## 2016-05-06 DIAGNOSIS — M25661 Stiffness of right knee, not elsewhere classified: Secondary | ICD-10-CM | POA: Diagnosis not present

## 2016-05-06 DIAGNOSIS — R2689 Other abnormalities of gait and mobility: Secondary | ICD-10-CM | POA: Diagnosis not present

## 2016-05-06 DIAGNOSIS — M25561 Pain in right knee: Secondary | ICD-10-CM | POA: Diagnosis not present

## 2016-05-11 DIAGNOSIS — M25561 Pain in right knee: Secondary | ICD-10-CM | POA: Diagnosis not present

## 2016-05-11 DIAGNOSIS — R2689 Other abnormalities of gait and mobility: Secondary | ICD-10-CM | POA: Diagnosis not present

## 2016-05-11 DIAGNOSIS — M25661 Stiffness of right knee, not elsewhere classified: Secondary | ICD-10-CM | POA: Diagnosis not present

## 2016-05-13 DIAGNOSIS — R2689 Other abnormalities of gait and mobility: Secondary | ICD-10-CM | POA: Diagnosis not present

## 2016-05-13 DIAGNOSIS — M25561 Pain in right knee: Secondary | ICD-10-CM | POA: Diagnosis not present

## 2016-05-13 DIAGNOSIS — M25661 Stiffness of right knee, not elsewhere classified: Secondary | ICD-10-CM | POA: Diagnosis not present

## 2016-05-18 DIAGNOSIS — H903 Sensorineural hearing loss, bilateral: Secondary | ICD-10-CM | POA: Diagnosis not present

## 2016-05-18 DIAGNOSIS — M25561 Pain in right knee: Secondary | ICD-10-CM | POA: Diagnosis not present

## 2016-05-18 DIAGNOSIS — M25661 Stiffness of right knee, not elsewhere classified: Secondary | ICD-10-CM | POA: Diagnosis not present

## 2016-05-18 DIAGNOSIS — R2689 Other abnormalities of gait and mobility: Secondary | ICD-10-CM | POA: Diagnosis not present

## 2016-05-18 DIAGNOSIS — R42 Dizziness and giddiness: Secondary | ICD-10-CM | POA: Diagnosis not present

## 2016-05-18 DIAGNOSIS — H93293 Other abnormal auditory perceptions, bilateral: Secondary | ICD-10-CM | POA: Diagnosis not present

## 2016-05-20 DIAGNOSIS — M25561 Pain in right knee: Secondary | ICD-10-CM | POA: Diagnosis not present

## 2016-05-20 DIAGNOSIS — R2689 Other abnormalities of gait and mobility: Secondary | ICD-10-CM | POA: Diagnosis not present

## 2016-05-20 DIAGNOSIS — M25661 Stiffness of right knee, not elsewhere classified: Secondary | ICD-10-CM | POA: Diagnosis not present

## 2016-05-21 DIAGNOSIS — M25561 Pain in right knee: Secondary | ICD-10-CM | POA: Diagnosis not present

## 2016-05-25 ENCOUNTER — Ambulatory Visit: Payer: Medicare Other | Admitting: Obstetrics & Gynecology

## 2016-05-25 DIAGNOSIS — R2689 Other abnormalities of gait and mobility: Secondary | ICD-10-CM | POA: Diagnosis not present

## 2016-05-25 DIAGNOSIS — M25561 Pain in right knee: Secondary | ICD-10-CM | POA: Diagnosis not present

## 2016-05-25 DIAGNOSIS — M25661 Stiffness of right knee, not elsewhere classified: Secondary | ICD-10-CM | POA: Diagnosis not present

## 2016-06-03 DIAGNOSIS — I2510 Atherosclerotic heart disease of native coronary artery without angina pectoris: Secondary | ICD-10-CM | POA: Diagnosis not present

## 2016-06-03 DIAGNOSIS — K862 Cyst of pancreas: Secondary | ICD-10-CM | POA: Diagnosis not present

## 2016-06-03 DIAGNOSIS — K869 Disease of pancreas, unspecified: Secondary | ICD-10-CM | POA: Diagnosis not present

## 2016-06-07 DIAGNOSIS — R0602 Shortness of breath: Secondary | ICD-10-CM | POA: Diagnosis not present

## 2016-06-07 DIAGNOSIS — R0600 Dyspnea, unspecified: Secondary | ICD-10-CM | POA: Diagnosis not present

## 2016-06-07 DIAGNOSIS — R0609 Other forms of dyspnea: Secondary | ICD-10-CM | POA: Diagnosis not present

## 2016-06-15 DIAGNOSIS — F3289 Other specified depressive episodes: Secondary | ICD-10-CM | POA: Diagnosis not present

## 2016-06-15 DIAGNOSIS — I10 Essential (primary) hypertension: Secondary | ICD-10-CM | POA: Diagnosis not present

## 2016-06-15 DIAGNOSIS — K210 Gastro-esophageal reflux disease with esophagitis: Secondary | ICD-10-CM | POA: Diagnosis not present

## 2016-06-15 DIAGNOSIS — M810 Age-related osteoporosis without current pathological fracture: Secondary | ICD-10-CM | POA: Diagnosis not present

## 2016-06-21 DIAGNOSIS — H903 Sensorineural hearing loss, bilateral: Secondary | ICD-10-CM | POA: Diagnosis not present

## 2016-07-08 DIAGNOSIS — I6521 Occlusion and stenosis of right carotid artery: Secondary | ICD-10-CM | POA: Diagnosis not present

## 2016-07-08 DIAGNOSIS — R42 Dizziness and giddiness: Secondary | ICD-10-CM | POA: Diagnosis not present

## 2016-07-09 DIAGNOSIS — H43813 Vitreous degeneration, bilateral: Secondary | ICD-10-CM | POA: Diagnosis not present

## 2016-07-09 DIAGNOSIS — H524 Presbyopia: Secondary | ICD-10-CM | POA: Diagnosis not present

## 2016-07-09 DIAGNOSIS — H16223 Keratoconjunctivitis sicca, not specified as Sjogren's, bilateral: Secondary | ICD-10-CM | POA: Diagnosis not present

## 2016-07-09 DIAGNOSIS — H2513 Age-related nuclear cataract, bilateral: Secondary | ICD-10-CM | POA: Diagnosis not present

## 2016-07-09 DIAGNOSIS — H43811 Vitreous degeneration, right eye: Secondary | ICD-10-CM | POA: Diagnosis not present

## 2016-07-09 DIAGNOSIS — H04123 Dry eye syndrome of bilateral lacrimal glands: Secondary | ICD-10-CM | POA: Diagnosis not present

## 2016-07-09 DIAGNOSIS — H52223 Regular astigmatism, bilateral: Secondary | ICD-10-CM | POA: Diagnosis not present

## 2016-07-09 DIAGNOSIS — H5213 Myopia, bilateral: Secondary | ICD-10-CM | POA: Diagnosis not present

## 2016-07-09 DIAGNOSIS — H43812 Vitreous degeneration, left eye: Secondary | ICD-10-CM | POA: Diagnosis not present

## 2016-07-15 DIAGNOSIS — H903 Sensorineural hearing loss, bilateral: Secondary | ICD-10-CM | POA: Diagnosis not present

## 2016-07-15 DIAGNOSIS — H93293 Other abnormal auditory perceptions, bilateral: Secondary | ICD-10-CM | POA: Diagnosis not present

## 2016-07-15 DIAGNOSIS — R42 Dizziness and giddiness: Secondary | ICD-10-CM | POA: Diagnosis not present

## 2016-07-16 DIAGNOSIS — M25561 Pain in right knee: Secondary | ICD-10-CM | POA: Diagnosis not present

## 2016-07-30 DIAGNOSIS — M25561 Pain in right knee: Secondary | ICD-10-CM | POA: Diagnosis not present

## 2016-08-06 ENCOUNTER — Ambulatory Visit: Payer: Medicare Other | Admitting: Obstetrics & Gynecology

## 2016-08-06 DIAGNOSIS — S70361A Insect bite (nonvenomous), right thigh, initial encounter: Secondary | ICD-10-CM | POA: Diagnosis not present

## 2016-08-12 DIAGNOSIS — K210 Gastro-esophageal reflux disease with esophagitis: Secondary | ICD-10-CM | POA: Diagnosis not present

## 2016-08-12 DIAGNOSIS — I10 Essential (primary) hypertension: Secondary | ICD-10-CM | POA: Diagnosis not present

## 2016-08-12 DIAGNOSIS — M810 Age-related osteoporosis without current pathological fracture: Secondary | ICD-10-CM | POA: Diagnosis not present

## 2016-08-12 DIAGNOSIS — F3289 Other specified depressive episodes: Secondary | ICD-10-CM | POA: Diagnosis not present

## 2016-08-13 ENCOUNTER — Encounter: Payer: Self-pay | Admitting: Obstetrics & Gynecology

## 2016-08-13 ENCOUNTER — Ambulatory Visit (INDEPENDENT_AMBULATORY_CARE_PROVIDER_SITE_OTHER): Payer: Medicare Other | Admitting: Obstetrics & Gynecology

## 2016-08-13 VITALS — BP 122/80 | HR 80 | Wt 136.0 lb

## 2016-08-13 DIAGNOSIS — Z4689 Encounter for fitting and adjustment of other specified devices: Secondary | ICD-10-CM | POA: Diagnosis not present

## 2016-08-13 DIAGNOSIS — N816 Rectocele: Secondary | ICD-10-CM

## 2016-08-13 NOTE — Progress Notes (Signed)
Chief Complaint  Patient presents with  . pessary maintanence    Blood pressure 122/80, pulse 80, weight 136 lb (61.7 kg).   Lydia Lara comes in today for routine follow-up related to her pessary She uses a Milex ring with support #2 and always is very vocal in her pleasure about having to come in and do this She takes it out herself before she comes in and I asked her today how long as it taken she said "a long time"  Exam reveals no undue vaginal mucosal pressure or breakdown or discharge no vaginal bleeding  The pessary was removed to its cleaned without difficulty and replaced without any mucosal problem  As usual we'll see her back in 4 months for ongoing pessary maintenance  Florian Buff, MD 08/13/2016 11:05 AM

## 2016-09-21 ENCOUNTER — Ambulatory Visit (INDEPENDENT_AMBULATORY_CARE_PROVIDER_SITE_OTHER): Payer: Medicare Other | Admitting: Sports Medicine

## 2016-09-21 ENCOUNTER — Encounter: Payer: Self-pay | Admitting: Sports Medicine

## 2016-09-21 VITALS — BP 116/72 | HR 80 | Ht 60.0 in | Wt 137.0 lb

## 2016-09-21 DIAGNOSIS — M1711 Unilateral primary osteoarthritis, right knee: Secondary | ICD-10-CM | POA: Insufficient documentation

## 2016-09-21 NOTE — Assessment & Plan Note (Addendum)
Degenerative osteoarthritic changes on exam with prior MRI that supported tricompartmental degenerative changes.  She is status post medial meniscectomy with out significant improvement in this.  She has been discussing Visco supplementation and recommend that she proceed with this and will plan to get her preapproved for Monovisc injection.  Additionally we will have her try compression on a regular basis in the interim.  If she has good improvement with this we can consider deferring injection but will plan to see her back at her convenience for Visco supplementation.  Also can consider Voltaren gel at follow-up

## 2016-09-21 NOTE — Progress Notes (Signed)
OFFICE VISIT NOTE Lydia Lara. Jian Hodgman, [place] at [facility] [phone]  Lydia Lara - 71 y.o. female MRN [id number]  Date of birth: Aug 18, 1945  Visit Date: 09/21/2016  PCP: Neale Burly, MD   Referred by: Neale Burly, MD  Burlene Arnt, CMA acting as scribe for Dr. Paulla Fore.  SUBJECTIVE:   Chief Complaint  Patient presents with  . New Patient (Initial Visit)    RT knee pain   HPI: As below and per problem based documentation when appropriate.  Lydia Lara is a new patient presenting today with complaint of RT knee pain. She has surgery 04/14/16 and then did 5 weeks off PT d/t torn meniscus and arthritis (Dr. Tomi Likens). She had steroid injection about 1 month ago but pain came back after about 2 weeks. Pain is mostly on the medial and posterior aspect of the knee. She does have swelling in the knee which is worse after being on her feet.   The pain is described as occasional aching and stabbing varied from 1/0-10/10.   Worsened with standing for prolonged periods of time. Pain is also worse after walking. She has no pain while walking but pain starts the evening after walking. Pain is worse when using stairs, up worse than down. Her knee stiffens up when first standing after sitting for a prolonged period of time.  Improves with rest. Therapies tried include : Voltaren gel with some relief. She ices the knee almost daily.   Other associated symptoms include: Pain radiates from the knee to the foot.     Review of Systems  Constitutional: Positive for chills and fever (after tick bite).  Respiratory: Positive for shortness of breath (pulmonary fibrosis). Negative for wheezing.   Cardiovascular: Positive for leg swelling (knee). Negative for chest pain and palpitations.  Musculoskeletal: Positive for back pain (arthritis) and joint pain. Negative for falls.  Neurological: Positive for dizziness and tingling. Negative for headaches.    Endo/Heme/Allergies: Bruises/bleeds easily.    Otherwise per HPI.  HISTORY & PERTINENT PRIOR DATA:  09/21/2016 - Monovisc 20% coinsurance - BLS She reports that she has never smoked. She has never used smokeless tobacco. No results for input(s): HGBA1C, LABURIC in the last 8760 hours. Medications & Allergies reviewed per EMR Patient Active Problem List   Diagnosis Date Noted  . Primary osteoarthritis of right knee 09/21/2016  . Cystocele 06/09/2015  . Rectocele 06/09/2015  . Back pain at L4-L5 level 06/09/2015  . Thickened endometrium 12/20/2013  . RLQ abdominal pain 12/06/2013  . Short of breath on exertion 12/06/2013  . Menopausal vaginal dryness 12/06/2013  . Vulvar abscess 05/14/2013   Past Medical History:  Diagnosis Date  . Allergy   . Anxiety   . Back pain at L4-L5 level 06/09/2015  . Cystocele 06/09/2015  . Granuloma annulare   . History of stomach ulcers   . Hyperlipidemia   . Menopausal vaginal dryness 12/06/2013  . Neuropathy   . Rectocele 06/09/2015  . RLQ abdominal pain 12/06/2013  . Short of breath on exertion 12/06/2013  . Thickened endometrium 12/20/2013  . Vulvar abscess 05/14/2013   Family History  Problem Relation Age of Onset  . Cancer Mother        colon   . Alzheimer's disease Maternal Aunt   . COPD Maternal Uncle   . Other Father        had a pacemaker  . Cancer Father        prostate  Past Surgical History:  Procedure Laterality Date  . FOOT SURGERY Bilateral   . Rt wristsurgery    . TUBAL LIGATION     Social History   Occupational History  . Not on file.   Social History Main Topics  . Smoking status: Never Smoker  . Smokeless tobacco: Never Used  . Alcohol use 2.4 oz/week    4 Glasses of wine per week     Comment: wine 2 times a week  . Drug use: No  . Sexual activity: Not Currently    Birth control/ protection: Post-menopausal, Surgical     Comment: tubal    OBJECTIVE:  VS:  HT:5' (152.4 cm)   WT:137 lb (62.1 kg)   BMI:26.76    BP:116/72  HR:80bpm  TEMP: ( )  RESP:97 % EXAM: Findings:  WDWN, NAD, Non-toxic appearing Alert & appropriately interactive Not depressed or anxious appearing No increased work of breathing. Pupils are equal. EOM intact without nystagmus No clubbing or cyanosis of the extremities appreciated No significant rashes/lesions/ulcerations overlying the examined area. DP & PT pulses 2+/4.  No significant pretibial edema.  No clubbing or cyanosis Sensation intact to light touch in lower extremities.  Right Knee: Overall joint is well aligned, she does have generalized osteoarthritic bossing worse in the medial compartment. Generalized synovitis without significant effusion.  Positive patellar grind..   ROM: 0 to 120.   Extensor mechanism intact with poor VMO definition bilaterally. No focal medial or lateral joint line tenderness but she does localize to the medial joint line the majority of her pain.  No focal pain over the pes bursa..   Stable to varus/valgus strain & anterior/posterior drawer.    Pain with McMurray's but no focal mechanical clicking.  Pain localizes to the medial joint line..       Prior MRI reviewed that showed tricompartmental degenerative changes with a bulging medial meniscus.  This was dated from 2016. ASSESSMENT & PLAN:     ICD-10-CM   1. Primary osteoarthritis of right knee M17.11   ================================================================= Primary osteoarthritis of right knee Degenerative osteoarthritic changes on exam with prior MRI that supported tricompartmental degenerative changes.  She is status post medial meniscectomy with out significant improvement in this.  She has been discussing Visco supplementation and recommend that she proceed with this and will plan to get her preapproved for Monovisc injection.  Additionally we will have her try compression on a regular basis in the interim.  If she has good improvement with this we can  consider deferring injection but will plan to see her back at her convenience for Visco supplementation.  Also can consider Voltaren gel at follow-up ================================================================= Patient Instructions  We have provided you with a full knee Body Helix today.   You can use the compression sleeve at any time throughout the day but is most important to use while being active as well as for 2 hours post-activity.   It is appropriate to ice following activity with the compression sleeve in place.  We will also work on getting you preapproved for [employer] supplementation.  This is artificial joint fluid shots.  We will call you once this is obtained and plan to discuss having a return for this at your convenience.  =================================================================  Follow-up: Return if symptoms worsen or fail to improve.   CMA/ATC served as Education administrator during this visit. History, Physical, and Plan performed by medical provider. Documentation and orders reviewed and attested to.      Teresa Coombs,  [place] [facility] Physician

## 2016-09-21 NOTE — Patient Instructions (Signed)
We have provided you with a full knee Body Helix today.   You can use the compression sleeve at any time throughout the day but is most important to use while being active as well as for 2 hours post-activity.   It is appropriate to ice following activity with the compression sleeve in place.  We will also work on getting you preapproved for [employer] supplementation.  This is artificial joint fluid shots.  We will call you once this is obtained and plan to discuss having a return for this at your convenience.

## 2016-10-04 ENCOUNTER — Encounter: Payer: Self-pay | Admitting: Sports Medicine

## 2016-10-04 ENCOUNTER — Ambulatory Visit: Payer: Self-pay

## 2016-10-04 ENCOUNTER — Ambulatory Visit (INDEPENDENT_AMBULATORY_CARE_PROVIDER_SITE_OTHER): Payer: Medicare Other

## 2016-10-04 ENCOUNTER — Ambulatory Visit (INDEPENDENT_AMBULATORY_CARE_PROVIDER_SITE_OTHER): Payer: Medicare Other | Admitting: Sports Medicine

## 2016-10-04 VITALS — BP 122/76 | HR 89 | Ht 60.0 in | Wt 138.8 lb

## 2016-10-04 DIAGNOSIS — M25561 Pain in right knee: Secondary | ICD-10-CM | POA: Diagnosis not present

## 2016-10-04 DIAGNOSIS — M545 Low back pain, unspecified: Secondary | ICD-10-CM

## 2016-10-04 DIAGNOSIS — M1711 Unilateral primary osteoarthritis, right knee: Secondary | ICD-10-CM

## 2016-10-04 DIAGNOSIS — M5136 Other intervertebral disc degeneration, lumbar region: Secondary | ICD-10-CM | POA: Diagnosis not present

## 2016-10-04 DIAGNOSIS — S8991XA Unspecified injury of right lower leg, initial encounter: Secondary | ICD-10-CM | POA: Diagnosis not present

## 2016-10-04 DIAGNOSIS — M79604 Pain in right leg: Secondary | ICD-10-CM

## 2016-10-04 NOTE — Patient Instructions (Signed)

## 2016-10-04 NOTE — Progress Notes (Signed)
OFFICE VISIT NOTE Lydia Lara. Lydia Lara, [place] at [facility] [phone]  Lydia Lara - 71 y.o. female MRN [id number]  Date of birth: [birth year]  Visit Date: 10/04/2016  PCP: Lydia Burly, MD   Referred by: Lydia Burly, MD  Lydia Lara, CMA acting as scribe for Dr. Paulla Lara.  SUBJECTIVE:   Chief Complaint  Patient presents with  . right leg pain   HPI: As below and per problem based documentation when appropriate.  Lydia Lara is an established patient presenting today for evaluation of RT leg pain. She was last seen 09/21/2016 and was provided with full knee body helix compression sleeve.    Pt reports increased knee pain since her last visit. She was kneeling at church for a long period of time and felt no pain directly after that but when she went to leave she felt a radiating pain from her toes up to the knee. She has been wearing the Body Helix compression sleeve but she wasn't wearing it yesterday when the pain started. She reports that when she stood her knee gave out on her. She is unable to bear weight. She has noticed significant swelling around the knee. She did ice the knee last night with some relief. She has taken Hydrocodone with minimal relief. The pain did cause her to wake from sleep last night. Pain is only present when bearing weight and is rated 10/10.     Review of Systems  Constitutional: Negative for chills and fever.  Respiratory: Negative for shortness of breath and wheezing.   Cardiovascular: Negative for chest pain and palpitations.  Musculoskeletal: Positive for joint pain and myalgias. Negative for falls.  Neurological: Positive for tingling. Negative for dizziness and headaches.  Endo/Heme/Allergies: Does not bruise/bleed easily.    Otherwise per HPI.  HISTORY & PERTINENT PRIOR DATA:  09/21/2016 - Monovisc 20% coinsurance - BLS She reports that she has never smoked. She has never used smokeless  tobacco. No results for input(s): HGBA1C, LABURIC in the last 8760 hours. Medications & Allergies reviewed per EMR Patient Active Problem List   Diagnosis Date Noted  . Primary osteoarthritis of right knee 09/21/2016  . Cystocele 06/09/2015  . Rectocele 06/09/2015  . Back pain at L4-L5 level 06/09/2015  . Thickened endometrium 12/20/2013  . RLQ abdominal pain 12/06/2013  . Short of breath on exertion 12/06/2013  . Menopausal vaginal dryness 12/06/2013  . Vulvar abscess 05/14/2013   Past Medical History:  Diagnosis Date  . Allergy   . Anxiety   . Back pain at L4-L5 level 06/09/2015  . Cystocele 06/09/2015  . Granuloma annulare   . History of stomach ulcers   . Hyperlipidemia   . Menopausal vaginal dryness 12/06/2013  . Neuropathy   . Rectocele 06/09/2015  . RLQ abdominal pain 12/06/2013  . Short of breath on exertion 12/06/2013  . Thickened endometrium 12/20/2013  . Vulvar abscess 05/14/2013   Family History  Problem Relation Age of Onset  . Cancer Mother        colon   . Alzheimer's disease Maternal Aunt   . COPD Maternal Uncle   . Other Father        had a pacemaker  . Cancer Father        prostate   Past Surgical History:  Procedure Laterality Date  . FOOT SURGERY Bilateral   . Rt wristsurgery    . TUBAL LIGATION     Social History  Occupational History  . Not on file.   Social History Main Topics  . Smoking status: Never Smoker  . Smokeless tobacco: Never Used  . Alcohol use 2.4 oz/week    4 Glasses of wine per week     Comment: wine 2 times a week  . Drug use: No  . Sexual activity: Not Currently    Birth control/ protection: Post-menopausal, Surgical     Comment: tubal    OBJECTIVE:  VS:  HT:5' (152.4 cm)   WT:138 lb 12.8 oz (63 kg)  BMI:27.11    BP:122/76  HR:89bpm  TEMP: ( )  RESP:94 % EXAM: Findings:  Right knee overall well aligned.  She has only a small amount of generalized synovitis without significant effusion.  She is stable  ligamentously.  She has full range of motion with flexion and extension.  Pain with McMurray's.  Pain is most focal with weightbearing.  She has no pain with straight leg raise or with palpation of the popliteal fossa.     Dg Lumbar Spine 2-3 Views  Result Date: 10/04/2016 CLINICAL DATA:  Right leg pain. EXAM: LUMBAR SPINE - 2-3 VIEW COMPARISON:  None. FINDINGS: Diffuse degenerative disc and facet disease throughout the lumbar spine. Slight retrolisthesis of L3 on L4 related to facet disease. No fracture. SI joints are symmetric and unremarkable. IMPRESSION: Diffuse degenerative disc and facet disease.  No acute findings. Electronically Signed   By: Rolm Baptise M.D.   On: 10/04/2016 10:29   Dg Knee Complete 4 Views Right  Result Date: 10/04/2016 CLINICAL DATA:  Right knee pain after injury yesterday. EXAM: RIGHT KNEE - COMPLETE 4+ VIEW COMPARISON:  None. FINDINGS: No evidence of fracture, dislocation, or joint effusion. Moderate narrowing and osteophyte formation is seen involving the medial joint space. Mild narrowing and osteophyte formation is seen involving the patellofemoral space. Soft tissues are unremarkable. IMPRESSION: Moderate degenerative joint disease. No acute abnormality seen in the right knee. Electronically Signed   By: Marijo Conception, M.D.   On: 10/04/2016 10:31   [place] Limited Joint Space Structures Low Right(no Linked Charges)  Result Date: 10/04/2016 Gerda Diss, DO     10/04/2016 11:50 AM PROCEDURE NOTE - ULTRASOUND GUIDED INJECTION: Right Knee Images were obtained and interpreted by myself, Teresa Coombs, DO Images have been saved and stored to PACS system. Images obtained on: GE S7 Ultrasound machine  ULTRASOUND FINDINGS: Generalized synovitis, no significant effusion DESCRIPTION OF PROCEDURE: The patient's clinical condition is marked by substantial pain and/or significant functional disability. Other conservative therapy has not provided relief, is contraindicated, or not  appropriate. There is a reasonable likelihood that injection will significantly improve the patient's pain and/or functional impairment. After discussing the risks, benefits and expected outcomes of the injection and all questions were reviewed and answered, the patient wished to undergo the above named procedure. Verbal consent was obtained. The ultrasound was used to identify the target structure and adjacent neurovascular structures. The skin was then prepped in sterile fashion and the target structure was injected under direct visualization using sterile technique as below: PREP: Alcohol, Ethel Chloride APPROACH: Superiolateral, single injection, 25g 1.5" needle INJECTATE: 2cc 0.5% Marcaine, 2 cc 40mg  DepoMedrol ASPIRATE: N/A DRESSING: Band-Aid Post procedural instructions including recommending icing and warning signs for infection were reviewed. This procedure was well tolerated and there were no complications.  IMPRESSION: Succesful US Guided Injection   ASSESSMENT & PLAN:     ICD-10-CM   1. Right leg pain M79.604 DG Lumbar  Spine 2-3 Views    DG Knee Complete 4 Views Right    [place] LIMITED JOINT SPACE STRUCTURES LOW RIGHT(NO LINKED CHARGES)  2. Back pain at L4-L5 level M54.5   3. Primary osteoarthritis of right knee M17.11   ================================================================= Primary osteoarthritis of right knee Generalized synovitis with difficulty with weightbearing.  We will perform repeat injection today and consider Visco supplementation at her follow-up appointment in 6 weeks.  If any lack of improvement further diagnostic evaluation with either MRI of her lumbar spine or consideration of diagnostic/therapeutic arthroscopy could be considered but I suspect that ultimately she will require total knee arthroplasty if she does not improve with this injection.  Commend avoiding kneeling believe the majority of her symptoms are coming from patellofemoral joint.  Back pain at  L4-L5 level Marked changes on x-ray but no worrisome lesions. =================================================================  Follow-up: Return in about 6 weeks (around 11/15/2016).   CMA/ATC served as Education administrator during this visit. History, Physical, and Plan performed by medical provider. Documentation and orders reviewed and attested to.      Teresa Coombs, [facility] Physician

## 2016-10-04 NOTE — Assessment & Plan Note (Signed)
Marked changes on x-ray but no worrisome lesions.

## 2016-10-04 NOTE — Procedures (Signed)
PROCEDURE NOTE - ULTRASOUND GUIDED INJECTION: Right Knee Images were obtained and interpreted by myself, Teresa Coombs, DO  Images have been saved and stored to PACS system. Images obtained on: GE S7 Ultrasound machine  ULTRASOUND FINDINGS:  Generalized synovitis, no significant effusion  DESCRIPTION OF PROCEDURE:  The patient's clinical condition is marked by substantial pain and/or significant functional disability. Other conservative therapy has not provided relief, is contraindicated, or not appropriate. There is a reasonable likelihood that injection will significantly improve the patient's pain and/or functional impairment. After discussing the risks, benefits and expected outcomes of the injection and all questions were reviewed and answered, the patient wished to undergo the above named procedure. Verbal consent was obtained. The ultrasound was used to identify the target structure and adjacent neurovascular structures. The skin was then prepped in sterile fashion and the target structure was injected under direct visualization using sterile technique as below: PREP: Alcohol, Ethel Chloride APPROACH: Superiolateral, single injection, 25g 1.5" needle INJECTATE: 2cc 0.5% Marcaine, 2 cc 40mg  DepoMedrol ASPIRATE: N/A DRESSING: Band-Aid   Post procedural instructions including recommending icing and warning signs for infection were reviewed. This procedure was well tolerated and there were no complications.   IMPRESSION: Succesful US Guided Injection

## 2016-10-04 NOTE — Assessment & Plan Note (Signed)
Generalized synovitis with difficulty with weightbearing.  We will perform repeat injection today and consider Visco supplementation at her follow-up appointment in 6 weeks.  If any lack of improvement further diagnostic evaluation with either MRI of her lumbar spine or consideration of diagnostic/therapeutic arthroscopy could be considered but I suspect that ultimately she will require total knee arthroplasty if she does not improve with this injection.  Commend avoiding kneeling believe the majority of her symptoms are coming from patellofemoral joint.

## 2016-11-11 DIAGNOSIS — Z23 Encounter for immunization: Secondary | ICD-10-CM | POA: Diagnosis not present

## 2016-11-15 ENCOUNTER — Encounter: Payer: Self-pay | Admitting: Sports Medicine

## 2016-11-15 ENCOUNTER — Ambulatory Visit (INDEPENDENT_AMBULATORY_CARE_PROVIDER_SITE_OTHER): Payer: Medicare Other | Admitting: Sports Medicine

## 2016-11-15 VITALS — BP 124/70 | HR 71 | Ht 60.0 in | Wt 140.4 lb

## 2016-11-15 DIAGNOSIS — M1711 Unilateral primary osteoarthritis, right knee: Secondary | ICD-10-CM

## 2016-11-15 DIAGNOSIS — M79604 Pain in right leg: Secondary | ICD-10-CM

## 2016-11-15 NOTE — Progress Notes (Addendum)
OFFICE VISIT NOTE Lydia Lara. Lydia Lara, [name] at [facility] [phone]  Lydia Lara - 71 y.o. female MRN [id number]  Date of birth: 1946/01/11  Visit Date: 11/15/2016  PCP: Neale Burly, MD   Referred by: Neale Burly, MD  Thalia Bloodgood PT, LAT, ATC acting as scribe for Dr. Paulla Fore.  SUBJECTIVE:   Chief Complaint  Patient presents with  . Follow-up    R knee pain   HPI: As below and per problem based documentation when appropriate.  Lydia Lara is an established pt presenting today for f/u of her R knee pain.  Pt was last seen on 10/04/16 and was given an injection in her R knee.  Pt states that she was much improved after her R knee injection.  She notes that the pain relief took about 3 days after the injection.  She states she was doing well until last week and then the pain returned.  She recalls no new MOI.  She states that she didn't return to [employer] since the injection since she didn't ask what she could/couldn't do.  She states that she has no pain in her R knee but reports that she is experiencing catching and swelling.    ROS  Otherwise per HPI.  HISTORY & PERTINENT PRIOR DATA:  09/21/2016 - Monovisc 20% coinsurance - BLS She reports that she has never smoked. She has never used smokeless tobacco. No results for input(s): HGBA1C, LABURIC, CREATINE in the last 8760 hours.  Invalid input(s): CR Allergies reviewed per EMR Prior to Admission medications   Medication Sig Start Date End Date Taking? Authorizing Provider  CRESTOR 20 MG tablet Take 10 mg by mouth daily.  02/26/13   [provider]  fexofenadine (ALLEGRA) 30 MG tablet Take 30 mg by mouth daily.    [provider]  fexofenadine (ALLEGRA) 30 MG tablet Take by mouth.    [provider]  rosuvastatin (CRESTOR) 20 MG tablet Crestor 10 mg tablet  Take 1/2tablet every day by oral route.    [provider]  sertraline  (ZOLOFT) 100 MG tablet 100 mg daily.  03/13/13   [provider]  sertraline (ZOLOFT) 100 MG tablet Take by mouth.    [provider]  VOLTAREN 1 % GEL APPLY TWO GRAMS TO THE AFFECTED AREAS FOUR TIMES DAILY. 07/16/16   [provider]   Patient Active Problem List   Diagnosis Date Noted  . Primary osteoarthritis of right knee 09/21/2016  . Cystocele 06/09/2015  . Rectocele 06/09/2015  . Back pain at L4-L5 level 06/09/2015  . Thickened endometrium 12/20/2013  . RLQ abdominal pain 12/06/2013  . Short of breath on exertion 12/06/2013  . Menopausal vaginal dryness 12/06/2013  . Vulvar abscess 05/14/2013   Past Medical History:  Diagnosis Date  . Allergy   . Anxiety   . Back pain at L4-L5 level 06/09/2015  . Cystocele 06/09/2015  . Granuloma annulare   . History of stomach ulcers   . Hyperlipidemia   . Menopausal vaginal dryness 12/06/2013  . Neuropathy   . Rectocele 06/09/2015  . RLQ abdominal pain 12/06/2013  . Short of breath on exertion 12/06/2013  . Thickened endometrium 12/20/2013  . Vulvar abscess 05/14/2013   Family History  Problem Relation Age of Onset  . Cancer Mother        colon   . Alzheimer's disease Maternal Aunt   . COPD Maternal Uncle   .  Other Father        had a pacemaker  . Cancer Father        prostate   Past Surgical History:  Procedure Laterality Date  . FOOT SURGERY Bilateral   . Rt wristsurgery    . TUBAL LIGATION     Social History   Occupational History  . Not on file.   Social History Main Topics  . Smoking status: Never Smoker  . Smokeless tobacco: Never Used  . Alcohol use 2.4 oz/week    4 Glasses of wine per week     Comment: wine 2 times a week  . Drug use: No  . Sexual activity: Not Currently    Birth control/ protection: Post-menopausal, Surgical     Comment: tubal    OBJECTIVE:  VS:  HT:5' (152.4 cm)   WT:140 lb 6.4 oz (63.7 kg)  BMI:27.42    BP:124/70  HR:71bpm  TEMP: ( )  RESP:96  % EXAM: Findings:  Slight valgus deformity of the right knee.  She has 3-4 mm of opening with varus and valgus testing with a solid endpoint however.  Small amount of soft tissue swelling along the medial portal but there is no erythema or edema.  She does have a reproducible mechanical click with McMurray's but is asymptomatic from a pain standpoint with this.  Extensor mechanism is intact.  Poor VMO definition.  She has no effusion today which is significantly improved.    RADIOLOGY: [place] LIMITED JOINT SPACE STRUCTURES LOW RIGHT(NO LINKED CHARGES) Gerda Diss, DO     10/04/2016 11:50 AM PROCEDURE NOTE - ULTRASOUND GUIDED INJECTION: Right Knee Images were obtained and interpreted by myself, Teresa Coombs, DO   Images have been saved and stored to PACS system. Images obtained on: GE S7 Ultrasound machine  ULTRASOUND FINDINGS:  Generalized synovitis, no significant effusion  DESCRIPTION OF PROCEDURE:  The patient's clinical condition is marked by substantial pain  and/or significant functional disability. Other conservative  therapy has not provided relief, is contraindicated, or not  appropriate. There is a reasonable likelihood that injection will  significantly improve the patient's pain and/or functional  impairment. After discussing the risks, benefits and expected  outcomes of the injection and all questions were reviewed and  answered, the patient wished to undergo the above named  procedure. Verbal consent was obtained. The ultrasound was used  to identify the target structure and adjacent neurovascular  structures. The skin was then prepped in sterile fashion and the  target structure was injected under direct visualization using  sterile technique as below: PREP: Alcohol, Ethel Chloride APPROACH: Superiolateral, single injection, 25g 1.5" needle INJECTATE: 2cc 0.5% Marcaine, 2 cc 40mg  DepoMedrol ASPIRATE: N/A DRESSING: Band-Aid   Post procedural instructions  including recommending icing and  warning signs for infection were reviewed. This procedure was  well tolerated and there were no complications.   IMPRESSION: Succesful US Guided Injection DG Knee Complete 4 Views Right CLINICAL DATA:  Right knee pain after injury yesterday.  EXAM: RIGHT KNEE - COMPLETE 4+ VIEW  COMPARISON:  None.  FINDINGS: No evidence of fracture, dislocation, or joint effusion. Moderate narrowing and osteophyte formation is seen involving the medial joint space. Mild narrowing and osteophyte formation is seen involving the patellofemoral space. Soft tissues are unremarkable.  IMPRESSION: Moderate degenerative joint disease. No acute abnormality seen in the right knee.  Electronically Signed   By: Marijo Conception, M.D.   On: 10/04/2016 10:31 DG Lumbar Spine 2-3 Views CLINICAL  DATA:  Right leg pain.  EXAM: LUMBAR SPINE - 2-3 VIEW  COMPARISON:  None.  FINDINGS: Diffuse degenerative disc and facet disease throughout the lumbar spine. Slight retrolisthesis of L3 on L4 related to facet disease. No fracture. SI joints are symmetric and unremarkable.  IMPRESSION: Diffuse degenerative disc and facet disease.  No acute findings.  Electronically Signed   By: Rolm Baptise M.D.   On: 10/04/2016 10:29  ASSESSMENT & PLAN:     ICD-10-CM   1. Primary osteoarthritis of right knee M17.11   2. Right leg pain M79.604    ================================================================= Primary osteoarthritis of right knee Persistent medial compartment symptoms.  I am concerned for a persistent meniscal fragment versus focal chondral defect that is leading to symptoms.  I like her to try medial offloading brace through [employer] we will place an order for this today.  They will be in touch and we will plan to follow-up with her in 6-8 weeks after the brace is obtained.  Can consider referral for repeat diagnostic arthroscopy.  Will defer Visco supplementation at  this time given no pain and no significant effusion but can consider this in the future if symptoms return.  Continue topical diclofenac  >50% of this 25 minute visit spent in direct patient counseling and/or coordination of care.  Discussion was focused on education regarding the in discussing the pathoetiology and anticipated clinical course of the above condition.  Follow-up: Return in about 8 weeks (around 01/10/2017) for After medial offloading brace is obtained..   CMA/ATC served as Education administrator during this visit. History, Physical, and Plan performed by medical provider. Documentation and orders reviewed and attested to.      Teresa Coombs, [facility] Physician

## 2016-11-15 NOTE — Assessment & Plan Note (Addendum)
Persistent medial compartment symptoms.  I am concerned for a persistent meniscal fragment versus focal chondral defect that is leading to symptoms.  I like her to try medial offloading brace through [employer] we will place an order for this today.  They will be in touch and we will plan to follow-up with her in 6-8 weeks after the brace is obtained.  Can consider referral for repeat diagnostic arthroscopy.  Will defer Visco supplementation at this time given no pain and no significant effusion but can consider this in the future if symptoms return.  Continue topical diclofenac

## 2016-11-17 NOTE — Progress Notes (Signed)
Called pt and left VM to call the office to schedule follow-up appointment.

## 2016-12-13 ENCOUNTER — Ambulatory Visit (INDEPENDENT_AMBULATORY_CARE_PROVIDER_SITE_OTHER): Payer: Medicare Other | Admitting: Obstetrics & Gynecology

## 2016-12-13 ENCOUNTER — Encounter: Payer: Self-pay | Admitting: Obstetrics & Gynecology

## 2016-12-13 ENCOUNTER — Other Ambulatory Visit: Payer: Self-pay

## 2016-12-13 VITALS — BP 126/80 | HR 98 | Ht 60.0 in | Wt 142.0 lb

## 2016-12-13 DIAGNOSIS — Z4689 Encounter for fitting and adjustment of other specified devices: Secondary | ICD-10-CM | POA: Diagnosis not present

## 2016-12-13 DIAGNOSIS — N8111 Cystocele, midline: Secondary | ICD-10-CM

## 2016-12-13 DIAGNOSIS — N816 Rectocele: Secondary | ICD-10-CM

## 2016-12-13 NOTE — Progress Notes (Signed)
Chief Complaint  Patient presents with  . Follow-up    pessary    Blood pressure 126/80, pulse 98, height 5' (1.524 m), weight 142 lb (64.4 kg).  Lydia Lara presents today for routine follow up maintainence related to her pessary.   She uses a Milex ring with support #2 She reports no vaginal discharge or vaginal bleeding.  Exam reveals no undue vaginal mucosal pressure of breakdown, no discharge and no vaginal bleeding.  The pessary is removed, cleaned and replaced without difficulty.   There is no undue vaginal mucosal breakdown or evidence of pressure  Lydia Lara will be sen back in 4 months for continued follow up.  Florian Buff, MD  12/13/2016 3:11 PM

## 2016-12-14 ENCOUNTER — Ambulatory Visit: Payer: Medicare Other | Admitting: Obstetrics & Gynecology

## 2016-12-17 ENCOUNTER — Encounter: Payer: Self-pay | Admitting: Obstetrics & Gynecology

## 2016-12-17 DIAGNOSIS — Z1231 Encounter for screening mammogram for malignant neoplasm of breast: Secondary | ICD-10-CM | POA: Diagnosis not present

## 2017-01-19 ENCOUNTER — Encounter: Payer: Self-pay | Admitting: Adult Health

## 2017-01-20 DIAGNOSIS — M810 Age-related osteoporosis without current pathological fracture: Secondary | ICD-10-CM | POA: Diagnosis not present

## 2017-01-20 DIAGNOSIS — K210 Gastro-esophageal reflux disease with esophagitis: Secondary | ICD-10-CM | POA: Diagnosis not present

## 2017-01-20 DIAGNOSIS — I10 Essential (primary) hypertension: Secondary | ICD-10-CM | POA: Diagnosis not present

## 2017-01-20 DIAGNOSIS — F3289 Other specified depressive episodes: Secondary | ICD-10-CM | POA: Diagnosis not present

## 2017-02-07 DIAGNOSIS — L570 Actinic keratosis: Secondary | ICD-10-CM | POA: Diagnosis not present

## 2017-02-07 DIAGNOSIS — Z85828 Personal history of other malignant neoplasm of skin: Secondary | ICD-10-CM | POA: Diagnosis not present

## 2017-02-07 DIAGNOSIS — L26 Exfoliative dermatitis: Secondary | ICD-10-CM | POA: Diagnosis not present

## 2017-02-08 ENCOUNTER — Ambulatory Visit (INDEPENDENT_AMBULATORY_CARE_PROVIDER_SITE_OTHER): Payer: Medicare Other | Admitting: Sports Medicine

## 2017-02-08 ENCOUNTER — Encounter: Payer: Self-pay | Admitting: Sports Medicine

## 2017-02-08 ENCOUNTER — Ambulatory Visit: Payer: Self-pay

## 2017-02-08 VITALS — BP 118/82 | HR 91 | Ht 60.0 in | Wt 147.2 lb

## 2017-02-08 DIAGNOSIS — M1711 Unilateral primary osteoarthritis, right knee: Secondary | ICD-10-CM | POA: Diagnosis not present

## 2017-02-08 NOTE — Patient Instructions (Signed)

## 2017-02-08 NOTE — Procedures (Signed)
PROCEDURE NOTE:  Ultrasound Guided: Injection: Right knee Images were obtained and interpreted by myself, Teresa Coombs, DO  Images have been saved and stored to PACS system. Images obtained on: GE S7 Ultrasound machine  ULTRASOUND FINDINGS:  No significant effusion, small Baker's cyst, degenerative medial compartment  DESCRIPTION OF PROCEDURE:  The patient's clinical condition is marked by substantial pain and/or significant functional disability. Other conservative therapy has not provided relief, is contraindicated, or not appropriate. There is a reasonable likelihood that injection will significantly improve the patient's pain and/or functional impairment.  After discussing the risks, benefits and expected outcomes of the injection and all questions were reviewed and answered, the patient wished to undergo the above named procedure. Verbal consent was obtained.  The ultrasound was used to identify the target structure and adjacent neurovascular structures. The skin was then prepped in sterile fashion and the target structure was injected under direct visualization using sterile technique as below:  PREP: Alcohol, Ethel Chloride,  APPROACH: superiolateral, single injection, 25g 1.5in. INJECTATE: 2cc 0.5% marcaine, 2cc 40mg /mL DepoMedrol   ASPIRATE: None   DRESSING: Band-Aid   Post procedural instructions including recommending icing and warning signs for infection were reviewed.  This procedure was well tolerated and there were no complications.   IMPRESSION: Succesful Ultrasound Guided: Injection

## 2017-02-08 NOTE — Progress Notes (Signed)
Lydia Lara. Lydia Lara, Lydia Lara at [facility] [phone]  Lydia Lara - 72 y.o. female MRN [id number]  Date of birth: 12/08/1945  Visit Date: 02/08/2017  PCP: Lydia Burly, MD   Referred by: Lydia Burly, MD   Scribe for today's visit: Lydia Lara, CMA     SUBJECTIVE:  Lydia Lara is here for Follow-up (RT knee pain)  Compared to the last office visit, her previously described symptoms of RT knee pain are: are worsening, sx started back about 2 weeks ago. She denies swelling. She has noticed that the pain is worse when trying to lift heavy objects. She was lifting her grandson at [place] and that when she initially felt the pain.  Current symptoms are moderate but can very from mild to severe. Pain does radiate into the calf and she says that the itch on the bottom of her foot has come back.  She has been using Voltaren gel 4 x daily. She has also tried taking Ibuprofen prn with some relief.    ROS Reports night time disturbances. Denies fevers, chills, or night sweats. Denies unexplained weight loss. Denies personal history of cancer. Denies changes in bowel or bladder habits. Denies recent unreported falls. Denies new or worsening dyspnea or wheezing. Denies headaches or dizziness.  Denies numbness, tingling or weakness  In the extremities.  Denies dizziness or presyncopal episodes Denies lower extremity edema     HISTORY & PERTINENT PRIOR DATA:  Prior History reviewed and updated per electronic medical record.  Significant history, findings, studies and interim changes include:  reports that  has never smoked. she has never used smokeless tobacco. No results for input(s): HGBA1C, LABURIC, CREATINE in the last 8760 hours. 02/09/2017 - Monovisc 20% coinsurance with Medicare, Lydia Lara will pay the remaining 20%. Deductible has been met, OOP does not apply. - BLS No problems updated.  OBJECTIVE:  VS:  HT:5' (152.4 cm)    WT:147 lb 3.2 oz (66.8 kg)  BMI:28.75    BP:118/82  HR:91bpm  TEMP: ( )  RESP:96 %   PHYSICAL EXAM: Constitutional: WDWN, Non-toxic appearing. Psychiatric: Alert & appropriately interactive.  Not depressed or anxious appearing. Respiratory: No increased work of breathing.  Trachea Midline Eyes: Pupils are equal.  EOM intact without nystagmus.  No scleral icterus  NEUROVASCULAR exam: No clubbing or cyanosis appreciated No significant venous stasis changes Capillary Refill: normal, less than 2 seconds   Right knee: Overall well aligned.  She has generalized osteoarthritic bossing that is mild.  Small amount of synovitis.  No significant effusion.  Ligamentously stable with pain with medial and lateral joint line palpation.  Negative McMurray's   No additional findings.   ASSESSMENT & PLAN:   1. Primary osteoarthritis of right knee    PLAN: Aspiration injection performed today per procedure note.  Recommend generalized strengthening and compression as well as avoidance of exacerbating activities.  Follow-up in 10 weeks for consideration of Visco supplementation..  No problem-specific Assessment & Plan notes found for this encounter.   ++++++++++++++++++++++++++++++++++++++++++++ Orders & Meds: Orders Placed This Encounter  Procedures  . [place] LIMITED JOINT SPACE STRUCTURES LOW RIGHT(NO LINKED CHARGES)    No orders of the defined types were placed in this encounter.   ++++++++++++++++++++++++++++++++++++++++++++ Follow-up: Return in about 10 weeks (around 04/19/2017) for Monovisc injection, prior authorization will be obtained.   Pertinent documentation may be included in additional procedure notes, imaging studies, problem based documentation and patient instructions. Please see these  sections of the encounter for additional information regarding this visit. CMA/ATC served as Education administrator during this visit. History, Physical, and Plan performed by medical provider. Documentation and  orders reviewed and attested to.      Gerda Lara, [facility] Physician

## 2017-02-09 ENCOUNTER — Ambulatory Visit: Payer: Medicare Other | Admitting: Sports Medicine

## 2017-02-28 ENCOUNTER — Encounter: Payer: Self-pay | Admitting: Sports Medicine

## 2017-03-14 ENCOUNTER — Ambulatory Visit (INDEPENDENT_AMBULATORY_CARE_PROVIDER_SITE_OTHER): Payer: Medicare Other | Admitting: Internal Medicine

## 2017-03-14 ENCOUNTER — Encounter (INDEPENDENT_AMBULATORY_CARE_PROVIDER_SITE_OTHER): Payer: Self-pay | Admitting: Internal Medicine

## 2017-03-14 ENCOUNTER — Encounter (INDEPENDENT_AMBULATORY_CARE_PROVIDER_SITE_OTHER): Payer: Self-pay | Admitting: *Deleted

## 2017-03-14 VITALS — BP 130/70 | HR 72 | Temp 98.0°F | Ht 60.0 in | Wt 144.2 lb

## 2017-03-14 DIAGNOSIS — Z800 Family history of malignant neoplasm of digestive organs: Secondary | ICD-10-CM | POA: Diagnosis not present

## 2017-03-14 DIAGNOSIS — R1319 Other dysphagia: Secondary | ICD-10-CM

## 2017-03-14 DIAGNOSIS — Z860100 Personal history of colon polyps, unspecified: Secondary | ICD-10-CM

## 2017-03-14 DIAGNOSIS — R1310 Dysphagia, unspecified: Secondary | ICD-10-CM | POA: Diagnosis not present

## 2017-03-14 NOTE — Patient Instructions (Signed)
DG esophagram.   

## 2017-03-14 NOTE — Progress Notes (Addendum)
   Subjective:    Patient ID: Lydia Lara, female    DOB: [birth year], 72 y.o.   MRN: [id number]  HPI Referred by Dr .Sherrie Sport for dysphagia.  Her last colonoscopy was 08/25/2012, Dr. Britta Mccreedy (family hx of colon cancer).  Personal hx of colonic polyps. (Normal) She tells me she is having some dysphagia. Foods feel like they are lodging in her upper esophagus. She has some nausea. Symptoms since [place]. She also c/o hoarseness. No foods in particular bother her.  Appetite is good. No weight loss. BMs x 2 a day. No melena or BRRB She wears a pessary, but removed 2 weeks ago.  Hx of rectocele and prolapsed bladder and uterus.    Hx of colon cancer in mother in her 80s.  Hx of colon polyps   Retired from [employer] and [employer] home [employer].    Review of Systems Past Medical History:  Diagnosis Date  . Allergy   . Anxiety   . Back pain at L4-L5 level 06/09/2015  . Cystocele 06/09/2015  . Granuloma annulare   . History of stomach ulcers   . Hyperlipidemia   . Menopausal vaginal dryness 12/06/2013  . Neuropathy   . Rectocele 06/09/2015  . RLQ abdominal pain 12/06/2013  . Short of breath on exertion 12/06/2013  . Thickened endometrium 12/20/2013  . Vulvar abscess 05/14/2013    Past Surgical History:  Procedure Laterality Date  . FOOT SURGERY Bilateral   . Rt wristsurgery    . TUBAL LIGATION      No Known Allergies  Current Outpatient Medications on File Prior to Visit  Medication Sig Dispense Refill  . BIOTIN PO Take by mouth.    . CRESTOR 20 MG tablet Take 10 mg by mouth daily.     . fexofenadine (ALLEGRA) 30 MG tablet Take 30 mg by mouth daily.    . sertraline (ZOLOFT) 100 MG tablet Take by mouth.    . TURMERIC PO Take by mouth.    . VOLTAREN 1 % GEL APPLY TWO GRAMS TO THE AFFECTED AREAS FOUR TIMES DAILY.  0   No current facility-administered medications on file prior to visit.         Objective:   Physical Exam Blood pressure 130/70, pulse 72, temperature 98 F  (36.7 C), height 5' (1.524 m), weight 144 lb 3.2 oz (65.4 kg). Alert and oriented. Skin warm and dry. Oral mucosa is moist.   . Sclera anicteric, conjunctivae is pink. Thyroid not enlarged. No cervical lymphadenopathy. Lungs clear. Heart regular rate and rhythm.  Abdomen is soft. Bowel sounds are positive. No hepatomegaly. No abdominal masses felt. No tenderness.  No edema to lower extremities.          Assessment & Plan:  Dysphagia: Am going to get an DG Espahgram.  Family hx of colon cancer, hx of polyps. Will be due in August for a colonoscopy. Waiting on esophagram.

## 2017-03-18 ENCOUNTER — Ambulatory Visit (HOSPITAL_COMMUNITY)
Admission: RE | Admit: 2017-03-18 | Discharge: 2017-03-18 | Disposition: A | Discharge status: Home / Self Care | Payer: Medicare Other | Source: Ambulatory Visit | Attending: Internal Medicine | Admitting: Internal Medicine

## 2017-03-18 DIAGNOSIS — R1310 Dysphagia, unspecified: Secondary | ICD-10-CM | POA: Diagnosis not present

## 2017-03-18 DIAGNOSIS — R1319 Other dysphagia: Secondary | ICD-10-CM

## 2017-03-18 DIAGNOSIS — T17308A Unspecified foreign body in larynx causing other injury, initial encounter: Secondary | ICD-10-CM | POA: Diagnosis not present

## 2017-03-21 ENCOUNTER — Ambulatory Visit (INDEPENDENT_AMBULATORY_CARE_PROVIDER_SITE_OTHER): Payer: Medicare Other | Admitting: Adult Health

## 2017-03-21 ENCOUNTER — Encounter: Payer: Self-pay | Admitting: Adult Health

## 2017-03-21 ENCOUNTER — Telehealth (INDEPENDENT_AMBULATORY_CARE_PROVIDER_SITE_OTHER): Payer: Self-pay | Admitting: *Deleted

## 2017-03-21 VITALS — BP 130/72 | HR 104 | Ht 60.0 in | Wt 144.5 lb

## 2017-03-21 DIAGNOSIS — N8111 Cystocele, midline: Secondary | ICD-10-CM | POA: Diagnosis not present

## 2017-03-21 DIAGNOSIS — Z4689 Encounter for fitting and adjustment of other specified devices: Secondary | ICD-10-CM

## 2017-03-21 DIAGNOSIS — N950 Postmenopausal bleeding: Secondary | ICD-10-CM

## 2017-03-21 NOTE — Progress Notes (Signed)
Subjective:     Patient ID: Lydia Lara, female   DOB: 03/11/1945, 72 y.o.   MRN: [id number]  HPI Lydia Lara is a 72 year old white female, married in to have pessary reinserted, she took it out when she noticed she was bleeding and had odor, and can't get in back in.   Review of Systems Has had some bleeding +odor at times She did notice pelvic and back discomfort when pessary was out  Reviewed past medical,surgical, social and family history. Reviewed medications and allergies.     Objective:   Physical Exam BP 130/72 (BP Location: Left Arm, Patient Position: Sitting, Cuff Size: Normal)   Pulse (!) 104   Ht 5' (1.524 m)   Wt 144 lb 8 oz (65.5 kg)   BMI 28.22 kg/m    Skin warm and dry.Pelvic: external genitalia is normal in appearance no lesions, vagina: pale pink , no irritation noted,urethra has no lesions or masses noted,+cystocele, cervix:smooth and atrophic, uterus: normal size, shape and contour, non tender, no masses felt, adnexa: no masses or tenderness noted. Bladder is non tender and no masses felt.  Pessary reinserted, and will get GYN [place] to assess uterus.  Assessment:     1. PMB (postmenopausal bleeding)   2. Pessary maintenance   3. Cystocele, midline       Plan:     Return in 1 week for GYN [place] Pessary maintenance in 3 months

## 2017-03-21 NOTE — Telephone Encounter (Signed)
  Patient resented to the office and states that she had a study on Friday and is complaining of bloating. Per Dr.Rehman the patient may have swallowed air , more that she may have thought. It was recommended that she take Phazyme OTC to help with the bloating. She also shares that she has been having URQ pain, patient states that she still has her Gallbladder , changes in her bowel movements. They are dark in color. She says that she has been taking Pepto Bismol , and she was made aware that this could change the color of her bowel movement. Patient has a history of bleeding ulcers. Hemoccult card x3 with instructions was given to the patient .   Dr.Rehman recommended that if the stool card were normal for blood , precede with Hidda Scan and if this is negative , patient may need to have a EGD.

## 2017-03-23 ENCOUNTER — Telehealth (INDEPENDENT_AMBULATORY_CARE_PROVIDER_SITE_OTHER): Payer: Self-pay | Admitting: *Deleted

## 2017-03-25 ENCOUNTER — Other Ambulatory Visit (INDEPENDENT_AMBULATORY_CARE_PROVIDER_SITE_OTHER): Payer: Self-pay | Admitting: *Deleted

## 2017-03-25 DIAGNOSIS — R1011 Right upper quadrant pain: Secondary | ICD-10-CM

## 2017-03-28 ENCOUNTER — Ambulatory Visit (INDEPENDENT_AMBULATORY_CARE_PROVIDER_SITE_OTHER): Payer: Medicare Other

## 2017-03-28 DIAGNOSIS — N950 Postmenopausal bleeding: Secondary | ICD-10-CM | POA: Diagnosis not present

## 2017-03-28 NOTE — Progress Notes (Signed)
PELVIC US TA/TV:homogeneous anteverted uterus,wnl,endometrium 3.8 mm,normal ovaries bilat,no free fluid

## 2017-03-29 ENCOUNTER — Encounter (HOSPITAL_COMMUNITY): Payer: Self-pay

## 2017-03-29 ENCOUNTER — Encounter (HOSPITAL_COMMUNITY)
Admission: RE | Admit: 2017-03-29 | Discharge: 2017-03-29 | Disposition: A | Discharge status: Home / Self Care | Payer: Medicare Other | Source: Ambulatory Visit | Attending: Internal Medicine | Admitting: Internal Medicine

## 2017-03-29 DIAGNOSIS — R1011 Right upper quadrant pain: Secondary | ICD-10-CM | POA: Diagnosis not present

## 2017-03-29 DIAGNOSIS — R110 Nausea: Secondary | ICD-10-CM | POA: Diagnosis not present

## 2017-03-29 MED ORDER — TECHNETIUM TC 99M MEBROFENIN IV KIT
5.0000 | PACK | Freq: Once | INTRAVENOUS | Status: AC | PRN
  Administered 2017-03-29: 5 via INTRAVENOUS

## 2017-03-31 ENCOUNTER — Ambulatory Visit (INDEPENDENT_AMBULATORY_CARE_PROVIDER_SITE_OTHER): Payer: Medicare Other | Admitting: Internal Medicine

## 2017-03-31 ENCOUNTER — Encounter (INDEPENDENT_AMBULATORY_CARE_PROVIDER_SITE_OTHER): Payer: Self-pay | Admitting: Internal Medicine

## 2017-03-31 VITALS — BP 120/80 | HR 80 | Temp 98.3°F | Ht 60.0 in | Wt 143.5 lb

## 2017-03-31 DIAGNOSIS — R1310 Dysphagia, unspecified: Secondary | ICD-10-CM

## 2017-03-31 DIAGNOSIS — R1319 Other dysphagia: Secondary | ICD-10-CM

## 2017-03-31 NOTE — Patient Instructions (Signed)
Recall for colonoscopy in August.

## 2017-03-31 NOTE — Progress Notes (Signed)
   Subjective:    Patient ID: Lydia Lara, female    DOB: 1945/02/01, 72 y.o.   MRN: [id number]  HPI Here today for f/u. Last seen 03/14/2016 for dysphagia.. She underwent an Esophagram which revealed IMPRESSION: Laryngeal penetration without aspiration. Minimal vallecular and piriform sinus residuals which were cleared by a second swallow. Remainder of exam unremarkable. She presented to office afterward c/o bloating. She was told she had probably swallowed some air and was advised to take Phazyme OTC to help with the bloating. She says the Phazyme worked.  She also started c/o RUQ pain.  She underwent a Hida Scan and it was normal.  She says the RUQ pain has now resolved. She does not have any GI complaints. She is due for a colonoscopy in August.  She also tells me her nausea has resolved.    Hx of colon cancer in mother in her 25s.  Hx of colon polyps       Her last colonoscopy was 08/25/2012, Dr. Britta Mccreedy (family hx of colon cancer).  Personal hx of colonic polyps. (Normal)      Review of Systems Past Medical History:  Diagnosis Date  . Allergy   . Anxiety   . Back pain at L4-L5 level 06/09/2015  . Cystocele 06/09/2015  . Granuloma annulare   . History of stomach ulcers   . Hyperlipidemia   . Menopausal vaginal dryness 12/06/2013  . Neuropathy   . Rectocele 06/09/2015  . RLQ abdominal pain 12/06/2013  . Short of breath on exertion 12/06/2013  . Thickened endometrium 12/20/2013  . Vulvar abscess 05/14/2013    Past Surgical History:  Procedure Laterality Date  . FOOT SURGERY Bilateral   . Rt wristsurgery    . TUBAL LIGATION      No Known Allergies  Current Outpatient Medications on File Prior to Visit  Medication Sig Dispense Refill  . BIOTIN PO Take by mouth.    . CRESTOR 20 MG tablet Take 10 mg by mouth daily.     . fexofenadine (ALLEGRA) 30 MG tablet Take 30 mg by mouth daily.    Marland Kitchen omeprazole (PRILOSEC) 20 MG capsule Take 20 mg by mouth at bedtime.    .  sertraline (ZOLOFT) 100 MG tablet Take by mouth daily.     . TURMERIC PO Take by mouth.    . VOLTAREN 1 % GEL APPLY TWO GRAMS TO THE AFFECTED AREAS FOUR TIMES DAILY PRN  0   No current facility-administered medications on file prior to visit.         Objective:   Physical Exam Blood pressure 120/80, pulse 80, temperature 98.3 F (36.8 C), height 5' (1.524 m), weight 143 lb 8 oz (65.1 kg). Alert and oriented. Skin warm and dry. Oral mucosa is moist.   . Sclera anicteric, conjunctivae is pink. Thyroid not enlarged. No cervical lymphadenopathy. Lungs clear. Heart regular rate and rhythm.  Abdomen is soft. Bowel sounds are positive. No hepatomegaly. No abdominal masses felt. No tenderness.  No edema to lower extremities.          Assessment & Plan:  Dysphagia. She is better. She has no GI complaints today. Will put her on a recall for a colonoscopy in August.

## 2017-04-05 ENCOUNTER — Encounter: Payer: Self-pay | Admitting: Adult Health

## 2017-04-08 ENCOUNTER — Ambulatory Visit: Payer: Medicare Other | Admitting: Adult Health

## 2017-04-12 ENCOUNTER — Ambulatory Visit: Payer: Medicare Other | Admitting: Adult Health

## 2017-04-14 ENCOUNTER — Ambulatory Visit: Payer: Self-pay

## 2017-04-14 ENCOUNTER — Ambulatory Visit (INDEPENDENT_AMBULATORY_CARE_PROVIDER_SITE_OTHER): Payer: Medicare Other | Admitting: Sports Medicine

## 2017-04-14 ENCOUNTER — Encounter: Payer: Self-pay | Admitting: Sports Medicine

## 2017-04-14 VITALS — BP 114/70 | HR 89 | Ht 60.0 in | Wt 144.2 lb

## 2017-04-14 DIAGNOSIS — M1711 Unilateral primary osteoarthritis, right knee: Secondary | ICD-10-CM | POA: Diagnosis not present

## 2017-04-14 DIAGNOSIS — M25561 Pain in right knee: Secondary | ICD-10-CM | POA: Diagnosis not present

## 2017-04-14 NOTE — Progress Notes (Signed)
Lydia Lara. [place], [place] at [facility] [phone]  Lydia Lara - 72 y.o. female MRN [id number]  Date of birth: [birth year]  Visit Date: 04/14/2017  PCP: Neale Burly, MD   Referred by: Neale Burly, MD  Scribe for today's visit: Wendy Poet, LAT, ATC     SUBJECTIVE:  Lydia Lara is here for Follow-up (R knee pain) .   02/08/17: Compared to the last office visit, her previously described symptoms of RT knee pain are: are worsening, sx started back about 2 weeks ago. She denies swelling. She has noticed that the pain is worse when trying to lift heavy objects. She was lifting her grandson at [place] and that when she initially felt the pain.  Current symptoms are moderate but can very from mild to severe. Pain does radiate into the calf and she says that the itch on the bottom of her foot has come back.  She has been using Voltaren gel 4 x daily. She has also tried taking Ibuprofen prn with some relief.    Compared to the last office visit on 02/08/17, her previously described R knee pain symptoms are worsening over the past couple days which may be due to yard work she's been doing.  She also notes that the cyst on the back of her knee is bothering her at this point. Current symptoms are moderate & are nonradiating She has been using Voltaren gel 4x/day.  She also takes IBU prn.  ROS Denies night time disturbances. Denies fevers, chills, or night sweats. Denies unexplained weight loss. Denies personal history of cancer. Denies changes in bowel or bladder habits. Denies recent unreported falls. Denies new or worsening dyspnea or wheezing. Denies headaches or dizziness.  Denies numbness, tingling or weakness  In the extremities.  Denies dizziness or presyncopal episodes Reports lower extremity edema - R knee swelling   HISTORY & PERTINENT PRIOR DATA:  Prior History reviewed and updated per electronic medical record.    Significant/pertinent history, findings, studies include:  reports that she has never smoked. She has never used smokeless tobacco. No results for input(s): HGBA1C, LABURIC, CREATINE in the last 8760 hours. 02/09/2017 - Monovisc 20% coinsurance with Medicare, Christella Scheuermann will pay the remaining 20%. Deductible has been met, OOP does not apply. - BLS No problems updated.  OBJECTIVE:  VS:  HT:5' (152.4 cm)   WT:144 lb 3.2 oz (65.4 kg)  BMI:28.16    BP:114/70  HR:89bpm  TEMP: ( )  RESP:96 %   PHYSICAL EXAM: Constitutional: WDWN, Non-toxic appearing. Psychiatric: Alert & appropriately interactive.  Not depressed or anxious appearing. Respiratory: No increased work of breathing.  Trachea Midline Eyes: Pupils are equal.  EOM intact without nystagmus.  No scleral icterus  VASCULAR: trace pedal edema lower extremity neuro exam: unremarkable  Knee with small amount of synovitis and small effusion + medial and lateral joint line pain   ASSESSMENT & PLAN:   1. Recurrent pain of right knee     PLAN:  Repeat injection today. F/u for Zilretta injections in 10-12 weeks. Will get approval        Follow-up: Return in about 2 months (around 06/23/2017).      Please see additional documentation for Objective, Assessment and Plan sections. Pertinent additional documentation may be included in corresponding procedure notes, imaging studies, problem based documentation and patient instructions. Please see these sections of the encounter for additional information regarding this visit.  CMA/ATC served as Education administrator during  this visit. History, Physical, and Plan performed by medical provider. Documentation and orders reviewed and attested to.      Gerda Diss, [facility] Physician

## 2017-04-14 NOTE — Patient Instructions (Signed)

## 2017-04-14 NOTE — Telephone Encounter (Signed)
Addressed.

## 2017-04-14 NOTE — Procedures (Signed)
PROCEDURE NOTE:  Ultrasound Guided: Injection: Right knee Images were obtained and interpreted by myself, Teresa Coombs, DO  Images have been saved and stored to PACS system. Images obtained on: GE S7 Ultrasound machine    ULTRASOUND FINDINGS:  Small effusion, general synovitis  DESCRIPTION OF PROCEDURE:  The patient's clinical condition is marked by substantial pain and/or significant functional disability. Other conservative therapy has not provided relief, is contraindicated, or not appropriate. There is a reasonable likelihood that injection will significantly improve the patient's pain and/or functional impairment.   After discussing the risks, benefits and expected outcomes of the injection and all questions were reviewed and answered, the patient wished to undergo the above named procedure.  Verbal consent was obtained.  The ultrasound was used to identify the target structure and adjacent neurovascular structures. The skin was then prepped in sterile fashion and the target structure was injected under direct visualization using sterile technique as below:  PREP: Alcohol, Ethel Chloride APPROACH: superiolateral, single injection, 25g 1.5in.  INJECTATE: 3cc: 0.5% marcaine, 1cc: 40mg /mL Kenalog   ASPIRATE: None   DRESSING: Band-Aid   Post procedural instructions including recommending icing and warning signs for infection were reviewed.    This procedure was well tolerated and there were no complications.   IMPRESSION: Succesful Ultrasound Guided: Injection

## 2017-04-19 ENCOUNTER — Ambulatory Visit: Payer: Medicare Other | Admitting: Sports Medicine

## 2017-04-21 DIAGNOSIS — F3289 Other specified depressive episodes: Secondary | ICD-10-CM | POA: Diagnosis not present

## 2017-04-21 DIAGNOSIS — M810 Age-related osteoporosis without current pathological fracture: Secondary | ICD-10-CM | POA: Diagnosis not present

## 2017-04-21 DIAGNOSIS — K210 Gastro-esophageal reflux disease with esophagitis: Secondary | ICD-10-CM | POA: Diagnosis not present

## 2017-04-21 DIAGNOSIS — E7849 Other hyperlipidemia: Secondary | ICD-10-CM | POA: Diagnosis not present

## 2017-04-21 DIAGNOSIS — I10 Essential (primary) hypertension: Secondary | ICD-10-CM | POA: Diagnosis not present

## 2017-06-09 DIAGNOSIS — N951 Menopausal and female climacteric states: Secondary | ICD-10-CM | POA: Diagnosis not present

## 2017-06-09 DIAGNOSIS — K862 Cyst of pancreas: Secondary | ICD-10-CM | POA: Diagnosis not present

## 2017-06-14 ENCOUNTER — Ambulatory Visit: Payer: Self-pay

## 2017-06-14 ENCOUNTER — Encounter: Payer: Self-pay | Admitting: Sports Medicine

## 2017-06-14 ENCOUNTER — Ambulatory Visit (INDEPENDENT_AMBULATORY_CARE_PROVIDER_SITE_OTHER): Payer: Medicare Other | Admitting: Sports Medicine

## 2017-06-14 VITALS — BP 118/68 | HR 73 | Ht 60.0 in | Wt 145.0 lb

## 2017-06-14 DIAGNOSIS — M1711 Unilateral primary osteoarthritis, right knee: Secondary | ICD-10-CM

## 2017-06-14 NOTE — Procedures (Signed)
PROCEDURE NOTE:  Ultrasound Guided: Injection: Right knee Images were obtained and interpreted by myself, Teresa Coombs, DO  Images have been saved and stored to PACS system. Images obtained on: GE S7 Ultrasound machine    ULTRASOUND FINDINGS:  + synovitis and small effusion  DESCRIPTION OF PROCEDURE:  The patient's clinical condition is marked by substantial pain and/or significant functional disability. Other conservative therapy has not provided relief, is contraindicated, or not appropriate. There is a reasonable likelihood that injection will significantly improve the patient's pain and/or functional impairment.   After discussing the risks, benefits and expected outcomes of the injection and all questions were reviewed and answered, the patient wished to undergo the above named procedure.  Verbal consent was obtained.  The ultrasound was used to identify the target structure and adjacent neurovascular structures. The skin was then prepped in sterile fashion and the target structure was injected under direct visualization using sterile technique as below:  PREP: Alcohol and Ethel Chloride APPROACH: superiolateral, single injection, 21g 2 in. INJECTATE: 5 cc Zillretta (32cc triamcinolone long acting) ASPIRATE: None DRESSING: Band-Aid  Post procedural instructions including recommending icing and warning signs for infection were reviewed.    This procedure was well tolerated and there were no complications.   IMPRESSION: Succesful Ultrasound Guided: Injection

## 2017-06-14 NOTE — Patient Instructions (Signed)

## 2017-06-14 NOTE — Progress Notes (Signed)
Lydia Lara. Lydia Lara, [place] at [facility] [phone]  Lydia Lara - 72 y.o. female MRN [id number]  Date of birth: 07-11-1945  Visit Date: 06/14/2017  PCP: Neale Burly, MD   Referred by: Neale Burly, MD  Scribe for today's visit: Josepha Pigg, CMA     SUBJECTIVE:  Lydia Lara is here for Follow-up (osteoarthritis R knee)  02/08/17: Compared to the last office visit, her previously described symptoms of RT knee pain are: are worsening, sx started back about 2 weeks ago. She denies swelling. She has noticed that the pain is worse when trying to lift heavy objects. She was lifting her grandson at [place] and that when she initially felt the pain.  Current symptoms are moderate but can very from mild to severe. Pain does radiate into the calf and she says that the itch on the bottom of her foot has come back.  She has been using Voltaren gel 4 x daily. She has also tried taking Ibuprofen prn with some relief.  Compared to the last office visit on 02/08/17, her previously described R knee pain symptoms are worsening over the past couple days which may be due to yard work she's been doing.  She also notes that the cyst on the back of her knee is bothering her at this point. Current symptoms are moderate & are nonradiating She has been using Voltaren gel 4x/day.  She also takes IBU prn.  06/14/2017: Compared to the last office visit, her previously described symptoms are stable and persistently painful. Current symptoms are moderate & are nonradiating  ROS Denies night time disturbances. Reports fevers, chills, or night sweats. Denies unexplained weight loss. Denies personal history of cancer. Denies changes in bowel or bladder habits. Denies recent unreported falls. Denies new or worsening dyspnea or wheezing. Reportsheadaches or dizziness.  Denies numbness, tingling or weakn ess  In the extremities.  Denies dizziness or  presyncopal episodes Denies lower extremity edema    HISTORY & PERTINENT PRIOR DATA:  Prior History reviewed and updated per electronic medical record.  Significant/pertinent history, findings, studies include:  reports that she has never smoked. She has never used smokeless tobacco. No results for input(s): HGBA1C, LABURIC, CREATINE in the last 8760 hours. 02/09/2017 - Monovisc 20% coinsurance with Medicare, Christella Scheuermann will pay the remaining 20%. Deductible has been met, OOP does not apply. - BLS  04/18/2017 - Zilretta covered, deductible has been met, medicare will cover 80%. Coinsurance, C[employer], will cover coinsurance (remaining 20%). No PA required. -BSC No problems updated.  OBJECTIVE:  VS:  HT:5' (152.4 cm)   WT:145 lb (65.8 kg)  BMI:28.32    BP:118/68  HR:73bpm  TEMP: ( )  RESP:95 %   PHYSICAL EXAM: Constitutional: WDWN, Non-toxic appearing. Psychiatric: Alert & appropriately interactive.  Not depressed or anxious appearing. Respiratory: No increased work of breathing.  Trachea Midline Eyes: Pupils are equal.  EOM intact without nystagmus.  No scleral icterus  Vascular Exam: warm to touch no edema  lower extremity neuro exam: unremarkable  MSK Exam: Right knee is overall well aligned with a small amount of synovitis and a small effusion.  She is ligamentously stable.  Pain with patellar grind.  Pain along the medial and lateral joint lines.   ASSESSMENT & PLAN:   1. Primary osteoarthritis of right knee     PLAN: Right knee Zilretta injection performed today.  Did discuss the ongoing risks and benefits of corticosteroids and she voiced  understanding of this.  Given the overall low dose of triamcinolone like this is appropriate given the persistent cellulitis that she has.  Other alternative options including Visco supplementation were discussed.  Follow-up: Return in about 3 months (around 09/06/2017).        Please see additional documentation for Objective,  Assessment and Plan sections. Pertinent additional documentation may be included in corresponding procedure notes, imaging studies, problem based documentation and patient instructions. Please see these sections of the encounter for additional information regarding this visit.  CMA/ATC served as Education administrator during this visit. History, Physical, and Plan performed by medical provider. Documentation and orders reviewed and attested to.      Gerda Diss, [place] [facility] Physician

## 2017-06-21 ENCOUNTER — Ambulatory Visit: Payer: Medicare Other | Admitting: Adult Health

## 2017-06-23 DIAGNOSIS — Z23 Encounter for immunization: Secondary | ICD-10-CM | POA: Diagnosis not present

## 2017-06-27 ENCOUNTER — Encounter: Payer: Self-pay | Admitting: Adult Health

## 2017-06-27 ENCOUNTER — Ambulatory Visit (INDEPENDENT_AMBULATORY_CARE_PROVIDER_SITE_OTHER): Payer: Medicare Other | Admitting: Adult Health

## 2017-06-27 VITALS — BP 136/82 | HR 93 | Ht 60.0 in | Wt 143.5 lb

## 2017-06-27 DIAGNOSIS — Z4689 Encounter for fitting and adjustment of other specified devices: Secondary | ICD-10-CM | POA: Diagnosis not present

## 2017-06-27 DIAGNOSIS — N8111 Cystocele, midline: Secondary | ICD-10-CM

## 2017-06-27 NOTE — Progress Notes (Signed)
  Subjective:     Patient ID: Lydia Lara, female   DOB: 12-06-1945, 72 y.o.   MRN: [id number]  HPI Lydia Lara is a 72 year old white female, married, PM in for pessary maintenance, and had 1 episode of wiping red blood. She did this in March and had normal [place] with EEC 3.8 mm.   Review of Systems Had red bleeding x 1 Not having sex Reviewed past medical,surgical, social and family history. Reviewed medications and allergies.     Objective:   Physical Exam BP 136/82 (BP Location: Left Arm, Patient Position: Sitting, Cuff Size: Normal)   Pulse 93   Ht 5' (1.524 m)   Wt 143 lb 8 oz (65.1 kg)   BMI 28.03 kg/m    Skin warm and dry.Pelvic: external genitalia is normal in appearance no lesions,pessary easily removed and cleansed with soap and water, vagina:is pale pink with loss of moisture and rugae,atrophic,urethra has no lesions or masses noted,+cystocele, cervix:smooth and atrophic, uterus: normal size, shape and contour, non tender, no masses felt, adnexa: no masses or tenderness noted. Bladder is non tender and no masses felt. Pessary easily inserted.   Assessment:     1. Pessary maintenance   2. Cystocele, midline       Plan:     F/u in 3 months for pessary maintenance, but may not is moving to [place].

## 2017-07-19 DIAGNOSIS — I10 Essential (primary) hypertension: Secondary | ICD-10-CM | POA: Diagnosis not present

## 2017-07-19 DIAGNOSIS — M810 Age-related osteoporosis without current pathological fracture: Secondary | ICD-10-CM | POA: Diagnosis not present

## 2017-07-19 DIAGNOSIS — F3289 Other specified depressive episodes: Secondary | ICD-10-CM | POA: Diagnosis not present

## 2017-07-19 DIAGNOSIS — E7849 Other hyperlipidemia: Secondary | ICD-10-CM | POA: Diagnosis not present

## 2017-07-19 DIAGNOSIS — R10824 Left lower quadrant rebound abdominal tenderness: Secondary | ICD-10-CM | POA: Diagnosis not present

## 2017-07-19 DIAGNOSIS — K210 Gastro-esophageal reflux disease with esophagitis: Secondary | ICD-10-CM | POA: Diagnosis not present

## 2017-07-20 DIAGNOSIS — I700 Atherosclerosis of aorta: Secondary | ICD-10-CM | POA: Diagnosis not present

## 2017-07-20 DIAGNOSIS — R1032 Left lower quadrant pain: Secondary | ICD-10-CM | POA: Diagnosis not present

## 2017-07-20 DIAGNOSIS — R1909 Other intra-abdominal and pelvic swelling, mass and lump: Secondary | ICD-10-CM | POA: Diagnosis not present

## 2017-07-20 DIAGNOSIS — R10824 Left lower quadrant rebound abdominal tenderness: Secondary | ICD-10-CM | POA: Diagnosis not present

## 2017-07-20 DIAGNOSIS — Z960 Presence of urogenital implants: Secondary | ICD-10-CM | POA: Diagnosis not present

## 2017-07-20 DIAGNOSIS — I878 Other specified disorders of veins: Secondary | ICD-10-CM | POA: Diagnosis not present

## 2017-07-26 ENCOUNTER — Telehealth: Payer: Self-pay | Admitting: Gastroenterology

## 2017-07-26 ENCOUNTER — Encounter: Payer: Self-pay | Admitting: Internal Medicine

## 2017-07-26 NOTE — Telephone Encounter (Signed)
Through care everywhere in epic I can tell that this patient is seen at [facility] for this same issue.  She had an endoscopic ultrasound there 2 years ago (Dr. Delrae Alfred) and in fact she saw a surgeon Dr. Bailey Mech at [facility]  who is a pancreatic surgeon within the past 5 or 6 weeks about this same issue.  Can you please let the referring doctor know that I believe this issue is being taken care of already at [facility].  Unless we hear back from the referring office  otherwise, I will assume that this referral was in error and is not necessary.

## 2017-07-26 NOTE — Telephone Encounter (Signed)
A user error has taken place: ERROR °

## 2017-08-03 ENCOUNTER — Telehealth: Payer: Self-pay | Admitting: Internal Medicine

## 2017-08-03 NOTE — Telephone Encounter (Signed)
Called pt and advised that unfortunately I am unable to find the reason for the call. No further action needed at this time.

## 2017-08-03 NOTE — Telephone Encounter (Signed)
PER [employer]HE[employer]  Patient called stating she missed a call and is returning the call.

## 2017-08-05 NOTE — Telephone Encounter (Signed)
Notified Abigail Butts at [facility] office Dr.Jacobs' recommendation.

## 2017-08-08 ENCOUNTER — Encounter: Payer: Self-pay | Admitting: Adult Health

## 2017-08-17 ENCOUNTER — Encounter (INDEPENDENT_AMBULATORY_CARE_PROVIDER_SITE_OTHER): Payer: Self-pay | Admitting: *Deleted

## 2017-08-23 DIAGNOSIS — H9201 Otalgia, right ear: Secondary | ICD-10-CM | POA: Diagnosis not present

## 2017-08-24 ENCOUNTER — Ambulatory Visit (INDEPENDENT_AMBULATORY_CARE_PROVIDER_SITE_OTHER): Payer: 59 | Admitting: Internal Medicine

## 2017-08-29 ENCOUNTER — Ambulatory Visit (INDEPENDENT_AMBULATORY_CARE_PROVIDER_SITE_OTHER): Payer: Medicare Other | Admitting: Sports Medicine

## 2017-08-29 ENCOUNTER — Encounter: Payer: Self-pay | Admitting: Sports Medicine

## 2017-08-29 VITALS — BP 114/70 | HR 100 | Ht 60.0 in | Wt 136.6 lb

## 2017-08-29 DIAGNOSIS — M1711 Unilateral primary osteoarthritis, right knee: Secondary | ICD-10-CM | POA: Diagnosis not present

## 2017-08-29 NOTE — Progress Notes (Signed)
[name]. Younes Degeorge, [place] at [facility] [phone]  Lydia Lara - 72 y.o. female MRN [id number]  Date of birth: 1945/12/02  Visit Date: 08/29/2017  PCP: Neale Burly, MD   Referred by: Neale Burly, MD  Scribe(s) for today's visit: Wendy Poet, LAT, ATC  SUBJECTIVE:  Lydia Lara is here for Follow-up (R knee pain) .    02/08/17: Compared to the last office visit, her previously described symptoms of RT knee pain are: are worsening, sx started back about 2 weeks ago. She denies swelling. She has noticed that the pain is worse when trying to lift heavy objects. She was lifting her grandson at [place] and that when she initially felt the pain.  Current symptoms are moderate but can very from mild to severe. Pain does radiate into the calf and she says that the itch on the bottom of her foot has come back.  She has been using Voltaren gel 4 x daily. She has also tried taking Ibuprofen prn with some relief.  Compared to the last office visit on 02/08/17, her previously described R knee pain symptoms are worsening over the past couple days which may be due to yard work she's been doing.  She also notes that the cyst on the back of her knee is bothering her at this point. Current symptoms are moderate & are nonradiating She has been using Voltaren gel 4x/day.  She also takes IBU prn.  06/14/2017: Compared to the last office visit, her previously described symptoms are stable and persistently painful. Current symptoms are moderate & are nonradiating  08/29/2017: Compared to the last office visit on 06/14/17, her previously described R knee pain symptoms are improving w/ no pain noted in her R knee.  She states that she will be moving this Saturday to [place], [place]. Current symptoms are not existent currently & are nonradiating She had a Zilretta injection in her R knee at her last visit.  Lumbar and R knee XR - 10/04/16   REVIEW  OF SYSTEMS: Denies night time disturbances. Reports fevers, chills, or night sweats.  Yes to night sweats. Denies unexplained weight loss. Denies personal history of cancer. Denies changes in bowel or bladder habits. Denies recent unreported falls. Denies new or worsening dyspnea or wheezing. Reports headaches or dizziness.  Has BPPV Denies numbness, tingling or weakness  In the extremities.  Denies dizziness or presyncopal episodes Denies lower extremity edema    HISTORY & PERTINENT PRIOR DATA:  Prior History reviewed and updated per electronic medical record.  Significant/pertinent history, findings, studies include:  reports that she has never smoked. She has never used smokeless tobacco. No results for input(s): HGBA1C, LABURIC, CREATINE in the last 8760 hours. 02/09/2017 - [employer] 20% coinsurance with Medicare, Christella Scheuermann will pay the remaining 20%. Deductible has been met, OOP does not apply. - BLS  04/18/2017 - Zilretta covered, deductible has been met, medicare will cover 80%. Coinsurance, [employer], will cover coinsurance (remaining 20%). No PA required. -BSC No problems updated.  OBJECTIVE:  VS:  HT:5' (152.4 cm)   WT:136 lb 9.6 oz (62 kg)  BMI:26.68    BP:114/70  HR:100bpm  TEMP: ( )  RESP:96 %   PHYSICAL EXAM: CONSTITUTIONAL: Well-developed, Well-nourished and In no acute distress Alert & appropriately interactive. and Not depressed or anxious appearing. Respiratory: No increased work of breathing.  Trachea Midline EYES: Pupils are equal., EOM intact without nystagmus. and No scleral icterus.  Lower extremities: Warm  and well perfused NEURO: unremarkable  MSK Exam: Right knee: . Generalized osteoarthritic bossing.  No overlying skin changes. . Mild medial and lateral joint line pain but this is minimal.  No significant effusion. . Normal, non-painful range of motion . Ligamentously stable .   PROCEDURES & DATA REVIEWED:  none  ASSESSMENT  No diagnosis  found.  PLAN:  She is done quite well following the Zilretta injection performed at last visit.  She will plan to follow-up in [place] where she is moving if any recurrence of symptoms.  I am always happy to see her back and she will call if she is unable to find a provider to provide this for her.  Follow-up: Return if symptoms worsen or fail to improve.      Please see additional documentation for Objective, Assessment and Plan sections. Pertinent additional documentation may be included in corresponding procedure notes, imaging studies, problem based documentation and patient instructions. Please see these sections of the encounter for additional information regarding this visit.  CMA/ATC served as Education administrator during this visit. History, Physical, and Plan performed by medical provider. Documentation and orders reviewed and attested to.      Gerda Diss, [place] [facility] Physician

## 2017-08-29 NOTE — Patient Instructions (Signed)
[facility]

## 2017-08-31 ENCOUNTER — Ambulatory Visit (INDEPENDENT_AMBULATORY_CARE_PROVIDER_SITE_OTHER): Payer: Medicare Other | Admitting: Adult Health

## 2017-08-31 ENCOUNTER — Encounter: Payer: Self-pay | Admitting: Adult Health

## 2017-08-31 VITALS — BP 139/84 | HR 77 | Ht 60.0 in | Wt 136.5 lb

## 2017-08-31 DIAGNOSIS — Z4689 Encounter for fitting and adjustment of other specified devices: Secondary | ICD-10-CM

## 2017-08-31 DIAGNOSIS — N899 Noninflammatory disorder of vagina, unspecified: Secondary | ICD-10-CM

## 2017-08-31 DIAGNOSIS — N898 Other specified noninflammatory disorders of vagina: Secondary | ICD-10-CM

## 2017-08-31 MED ORDER — ESTROGENS, CONJUGATED 0.625 MG/GM VA CREA: TOPICAL_CREAM | VAGINAL | 12 refills | Status: AC

## 2017-08-31 NOTE — Progress Notes (Addendum)
  Subjective:     Patient ID: Lydia Lara, female   DOB: 20-Feb-1945, 72 y.o.   MRN: [id number]  HPI Lydia Lara is a 72 year old white female in for pessary cleaning and she is having some spotting and cramping.She had [place] in March with EEC of 3.8  Review of Systems Spotting and cramping with pessary Reviewed past medical,surgical, social and family history. Reviewed medications and allergies.     Objective:   Physical Exam BP 139/84 (BP Location: Left Arm, Patient Position: Sitting, Cuff Size: Normal)   Pulse 77   Ht 5' (1.524 m)   Wt 136 lb 8 oz (61.9 kg)   BMI 26.66 kg/m Skin warm and dry.External genitalis is normal in appearance, no lesions, pessary easily removed, and cleansed with soap and water, vagina is atrophic and has area of irritation and spotting at 11 o'clock, +cystocele, cervix is atrophic, Dr Glo Herring in for co exam, uterus is NSSC, no tenderness noted, adnexa has no masses or tenderness noted, inserted 1 gm of premarin vaginal cream, and will leave pessary out for few days.Use PVC 1 gm in vagina daily and return Friday to reinsert pessary.She is moving to [facility] Saturday.    Examination chaperoned by Levy Pupa LPN.   Assessment:     1. Pessary maintenance   2. Vaginal irritation from pessary       Plan:     Leave pessary out Given 2 tubes premarin vaginal cream to use  Return Friday am for pessary reinsertion

## 2017-09-02 ENCOUNTER — Ambulatory Visit (INDEPENDENT_AMBULATORY_CARE_PROVIDER_SITE_OTHER): Payer: Medicare Other | Admitting: Adult Health

## 2017-09-02 ENCOUNTER — Encounter: Payer: Self-pay | Admitting: Adult Health

## 2017-09-02 VITALS — BP 115/73 | HR 85 | Ht 60.0 in | Wt 137.5 lb

## 2017-09-02 DIAGNOSIS — N899 Noninflammatory disorder of vagina, unspecified: Secondary | ICD-10-CM

## 2017-09-02 DIAGNOSIS — N8111 Cystocele, midline: Secondary | ICD-10-CM | POA: Diagnosis not present

## 2017-09-02 DIAGNOSIS — N898 Other specified noninflammatory disorders of vagina: Secondary | ICD-10-CM

## 2017-09-02 DIAGNOSIS — T8389XA Other specified complication of genitourinary prosthetic devices, implants and grafts, initial encounter: Secondary | ICD-10-CM | POA: Insufficient documentation

## 2017-09-02 NOTE — Progress Notes (Signed)
  Subjective:     Patient ID: Lydia Lara, female   DOB: 1945-06-29, 72 y.o.   MRN: [id number]  HPI Lydia Lara is a 72 year old white female in for pessary reinsertion, after leaving it out for few days and using PVC.She says her back has ached since having pessary out.  Review of Systems Has had back ache with pessary out Reviewed past medical,surgical, social and family history. Reviewed medications and allergies.     Objective:   Physical Exam BP 115/73 (BP Location: Left Arm, Patient Position: Sitting, Cuff Size: Normal)   Pulse 85   Ht 5' (1.524 m)   Wt 137 lb 8 oz (62.4 kg)   BMI 26.85 kg/m    Skin warm and dry.Pelvic: external genitalia is normal in appearance,has small sebaceous cyst let mons pubis, cheesy material expressed, vagina:is atrophic, and area of irritation at 11 o'clock is lessened after using PVC,+cystocele, pessary easily inserted, placed PVC in before insertion,urethra has no lesions or masses noted,  Assessment:       ICD-10-CM   1. Vaginal irritation from pessary N89.9   2. Cystocele, midline N81.11       Plan:   Continue using PVC at least 2 x weekly, has samples F/U prn,she is leaving for [facility] in am

## 2017-09-03 ENCOUNTER — Encounter: Payer: Self-pay | Admitting: Sports Medicine

## 2017-09-05 DIAGNOSIS — Z800 Family history of malignant neoplasm of digestive organs: Secondary | ICD-10-CM | POA: Diagnosis not present

## 2017-09-05 DIAGNOSIS — R197 Diarrhea, unspecified: Secondary | ICD-10-CM | POA: Diagnosis not present

## 2017-09-05 DIAGNOSIS — R9389 Abnormal findings on diagnostic imaging of other specified body structures: Secondary | ICD-10-CM | POA: Diagnosis not present

## 2017-09-06 ENCOUNTER — Ambulatory Visit: Payer: 59 | Admitting: Sports Medicine

## 2017-09-06 DIAGNOSIS — K869 Disease of pancreas, unspecified: Secondary | ICD-10-CM | POA: Diagnosis not present

## 2017-09-06 DIAGNOSIS — K8689 Other specified diseases of pancreas: Secondary | ICD-10-CM | POA: Diagnosis not present

## 2017-09-07 DIAGNOSIS — D136 Benign neoplasm of pancreas: Secondary | ICD-10-CM | POA: Diagnosis not present

## 2017-09-07 DIAGNOSIS — F419 Anxiety disorder, unspecified: Secondary | ICD-10-CM | POA: Diagnosis not present

## 2017-09-07 DIAGNOSIS — K8689 Other specified diseases of pancreas: Secondary | ICD-10-CM | POA: Diagnosis not present

## 2017-09-07 DIAGNOSIS — E785 Hyperlipidemia, unspecified: Secondary | ICD-10-CM | POA: Diagnosis not present

## 2017-09-07 DIAGNOSIS — Z79899 Other long term (current) drug therapy: Secondary | ICD-10-CM | POA: Diagnosis not present

## 2017-09-07 DIAGNOSIS — K869 Disease of pancreas, unspecified: Secondary | ICD-10-CM | POA: Diagnosis not present

## 2017-09-27 ENCOUNTER — Ambulatory Visit: Payer: Medicare Other | Admitting: Adult Health

## 2017-10-06 DIAGNOSIS — M1711 Unilateral primary osteoarthritis, right knee: Secondary | ICD-10-CM | POA: Diagnosis not present

## 2017-10-11 DIAGNOSIS — Z23 Encounter for immunization: Secondary | ICD-10-CM | POA: Diagnosis not present

## 2017-10-19 DIAGNOSIS — Z1211 Encounter for screening for malignant neoplasm of colon: Secondary | ICD-10-CM | POA: Diagnosis not present

## 2017-10-19 DIAGNOSIS — F419 Anxiety disorder, unspecified: Secondary | ICD-10-CM | POA: Diagnosis not present

## 2017-10-19 DIAGNOSIS — E785 Hyperlipidemia, unspecified: Secondary | ICD-10-CM | POA: Diagnosis not present

## 2017-10-19 DIAGNOSIS — Z79899 Other long term (current) drug therapy: Secondary | ICD-10-CM | POA: Diagnosis not present

## 2017-10-19 DIAGNOSIS — R197 Diarrhea, unspecified: Secondary | ICD-10-CM | POA: Diagnosis not present

## 2017-10-19 DIAGNOSIS — G988 Other disorders of nervous system: Secondary | ICD-10-CM | POA: Diagnosis not present

## 2017-10-19 DIAGNOSIS — Z86010 Personal history of colonic polyps: Secondary | ICD-10-CM | POA: Diagnosis not present

## 2017-10-19 DIAGNOSIS — K640 First degree hemorrhoids: Secondary | ICD-10-CM | POA: Diagnosis not present

## 2017-10-19 DIAGNOSIS — K5730 Diverticulosis of large intestine without perforation or abscess without bleeding: Secondary | ICD-10-CM | POA: Diagnosis not present

## 2017-10-25 DIAGNOSIS — M1711 Unilateral primary osteoarthritis, right knee: Secondary | ICD-10-CM | POA: Diagnosis not present

## 2017-11-02 DIAGNOSIS — H25813 Combined forms of age-related cataract, bilateral: Secondary | ICD-10-CM | POA: Diagnosis not present

## 2017-11-02 DIAGNOSIS — H16223 Keratoconjunctivitis sicca, not specified as Sjogren's, bilateral: Secondary | ICD-10-CM | POA: Diagnosis not present

## 2017-11-10 DIAGNOSIS — M1711 Unilateral primary osteoarthritis, right knee: Secondary | ICD-10-CM | POA: Diagnosis not present

## 2017-11-24 DIAGNOSIS — Z1283 Encounter for screening for malignant neoplasm of skin: Secondary | ICD-10-CM | POA: Diagnosis not present

## 2017-11-24 DIAGNOSIS — E782 Mixed hyperlipidemia: Secondary | ICD-10-CM | POA: Diagnosis not present

## 2017-11-24 DIAGNOSIS — F419 Anxiety disorder, unspecified: Secondary | ICD-10-CM | POA: Diagnosis not present

## 2017-12-07 DIAGNOSIS — G8929 Other chronic pain: Secondary | ICD-10-CM | POA: Diagnosis not present

## 2017-12-07 DIAGNOSIS — M1711 Unilateral primary osteoarthritis, right knee: Secondary | ICD-10-CM | POA: Diagnosis not present

## 2017-12-07 DIAGNOSIS — M25561 Pain in right knee: Secondary | ICD-10-CM | POA: Diagnosis not present

## 2017-12-22 DIAGNOSIS — I878 Other specified disorders of veins: Secondary | ICD-10-CM | POA: Diagnosis not present

## 2017-12-22 DIAGNOSIS — N952 Postmenopausal atrophic vaginitis: Secondary | ICD-10-CM | POA: Diagnosis not present

## 2017-12-22 DIAGNOSIS — N8111 Cystocele, midline: Secondary | ICD-10-CM | POA: Diagnosis not present

## 2017-12-30 DIAGNOSIS — I878 Other specified disorders of veins: Secondary | ICD-10-CM | POA: Diagnosis not present

## 2018-01-23 DIAGNOSIS — Z01818 Encounter for other preprocedural examination: Secondary | ICD-10-CM | POA: Diagnosis not present

## 2018-01-23 DIAGNOSIS — E782 Mixed hyperlipidemia: Secondary | ICD-10-CM | POA: Diagnosis not present

## 2018-01-24 DIAGNOSIS — E782 Mixed hyperlipidemia: Secondary | ICD-10-CM | POA: Diagnosis not present

## 2018-02-02 DIAGNOSIS — M25561 Pain in right knee: Secondary | ICD-10-CM | POA: Diagnosis not present

## 2018-02-02 DIAGNOSIS — I444 Left anterior fascicular block: Secondary | ICD-10-CM | POA: Diagnosis not present

## 2018-02-02 DIAGNOSIS — M1711 Unilateral primary osteoarthritis, right knee: Secondary | ICD-10-CM | POA: Diagnosis not present

## 2018-02-02 DIAGNOSIS — Z01810 Encounter for preprocedural cardiovascular examination: Secondary | ICD-10-CM | POA: Diagnosis not present

## 2018-02-06 DIAGNOSIS — J3089 Other allergic rhinitis: Secondary | ICD-10-CM | POA: Diagnosis not present

## 2018-02-06 DIAGNOSIS — G8918 Other acute postprocedural pain: Secondary | ICD-10-CM | POA: Diagnosis not present

## 2018-02-06 DIAGNOSIS — E785 Hyperlipidemia, unspecified: Secondary | ICD-10-CM | POA: Diagnosis not present

## 2018-02-06 DIAGNOSIS — M25561 Pain in right knee: Secondary | ICD-10-CM | POA: Diagnosis not present

## 2018-02-06 DIAGNOSIS — E7800 Pure hypercholesterolemia, unspecified: Secondary | ICD-10-CM | POA: Diagnosis not present

## 2018-02-06 DIAGNOSIS — F411 Generalized anxiety disorder: Secondary | ICD-10-CM | POA: Diagnosis not present

## 2018-02-06 DIAGNOSIS — Z79899 Other long term (current) drug therapy: Secondary | ICD-10-CM | POA: Diagnosis not present

## 2018-02-06 DIAGNOSIS — M1711 Unilateral primary osteoarthritis, right knee: Secondary | ICD-10-CM | POA: Diagnosis not present

## 2018-02-06 DIAGNOSIS — K219 Gastro-esophageal reflux disease without esophagitis: Secondary | ICD-10-CM | POA: Diagnosis not present

## 2018-02-07 DIAGNOSIS — E785 Hyperlipidemia, unspecified: Secondary | ICD-10-CM | POA: Diagnosis not present

## 2018-02-07 DIAGNOSIS — M1711 Unilateral primary osteoarthritis, right knee: Secondary | ICD-10-CM | POA: Diagnosis not present

## 2018-02-07 DIAGNOSIS — F411 Generalized anxiety disorder: Secondary | ICD-10-CM | POA: Diagnosis not present

## 2018-02-07 DIAGNOSIS — Z79899 Other long term (current) drug therapy: Secondary | ICD-10-CM | POA: Diagnosis not present

## 2018-02-07 DIAGNOSIS — K219 Gastro-esophageal reflux disease without esophagitis: Secondary | ICD-10-CM | POA: Diagnosis not present

## 2018-02-07 DIAGNOSIS — E7800 Pure hypercholesterolemia, unspecified: Secondary | ICD-10-CM | POA: Diagnosis not present

## 2018-02-07 DIAGNOSIS — J3089 Other allergic rhinitis: Secondary | ICD-10-CM | POA: Diagnosis not present

## 2018-02-08 DIAGNOSIS — Z79899 Other long term (current) drug therapy: Secondary | ICD-10-CM | POA: Diagnosis not present

## 2018-02-08 DIAGNOSIS — M1711 Unilateral primary osteoarthritis, right knee: Secondary | ICD-10-CM | POA: Diagnosis not present

## 2018-02-08 DIAGNOSIS — E7800 Pure hypercholesterolemia, unspecified: Secondary | ICD-10-CM | POA: Diagnosis not present

## 2018-02-08 DIAGNOSIS — K219 Gastro-esophageal reflux disease without esophagitis: Secondary | ICD-10-CM | POA: Diagnosis not present

## 2018-02-08 DIAGNOSIS — E785 Hyperlipidemia, unspecified: Secondary | ICD-10-CM | POA: Diagnosis not present

## 2018-02-08 DIAGNOSIS — J3089 Other allergic rhinitis: Secondary | ICD-10-CM | POA: Diagnosis not present

## 2018-02-08 DIAGNOSIS — F411 Generalized anxiety disorder: Secondary | ICD-10-CM | POA: Diagnosis not present

## 2018-02-09 DIAGNOSIS — K862 Cyst of pancreas: Secondary | ICD-10-CM | POA: Diagnosis not present

## 2018-02-09 DIAGNOSIS — Z471 Aftercare following joint replacement surgery: Secondary | ICD-10-CM | POA: Diagnosis not present

## 2018-02-09 DIAGNOSIS — Z96651 Presence of right artificial knee joint: Secondary | ICD-10-CM | POA: Diagnosis not present

## 2018-02-09 DIAGNOSIS — F419 Anxiety disorder, unspecified: Secondary | ICD-10-CM | POA: Diagnosis not present

## 2018-02-09 DIAGNOSIS — E785 Hyperlipidemia, unspecified: Secondary | ICD-10-CM | POA: Diagnosis not present

## 2018-02-09 DIAGNOSIS — F329 Major depressive disorder, single episode, unspecified: Secondary | ICD-10-CM | POA: Diagnosis not present

## 2018-02-09 DIAGNOSIS — Z7982 Long term (current) use of aspirin: Secondary | ICD-10-CM | POA: Diagnosis not present

## 2018-02-10 DIAGNOSIS — Z96651 Presence of right artificial knee joint: Secondary | ICD-10-CM | POA: Diagnosis not present

## 2018-02-10 DIAGNOSIS — F419 Anxiety disorder, unspecified: Secondary | ICD-10-CM | POA: Diagnosis not present

## 2018-02-10 DIAGNOSIS — K862 Cyst of pancreas: Secondary | ICD-10-CM | POA: Diagnosis not present

## 2018-02-10 DIAGNOSIS — F329 Major depressive disorder, single episode, unspecified: Secondary | ICD-10-CM | POA: Diagnosis not present

## 2018-02-10 DIAGNOSIS — E785 Hyperlipidemia, unspecified: Secondary | ICD-10-CM | POA: Diagnosis not present

## 2018-02-10 DIAGNOSIS — Z471 Aftercare following joint replacement surgery: Secondary | ICD-10-CM | POA: Diagnosis not present

## 2018-02-13 DIAGNOSIS — E785 Hyperlipidemia, unspecified: Secondary | ICD-10-CM | POA: Diagnosis not present

## 2018-02-13 DIAGNOSIS — Z471 Aftercare following joint replacement surgery: Secondary | ICD-10-CM | POA: Diagnosis not present

## 2018-02-13 DIAGNOSIS — Z96651 Presence of right artificial knee joint: Secondary | ICD-10-CM | POA: Diagnosis not present

## 2018-02-13 DIAGNOSIS — K862 Cyst of pancreas: Secondary | ICD-10-CM | POA: Diagnosis not present

## 2018-02-13 DIAGNOSIS — F329 Major depressive disorder, single episode, unspecified: Secondary | ICD-10-CM | POA: Diagnosis not present

## 2018-02-13 DIAGNOSIS — F419 Anxiety disorder, unspecified: Secondary | ICD-10-CM | POA: Diagnosis not present

## 2018-02-14 DIAGNOSIS — F419 Anxiety disorder, unspecified: Secondary | ICD-10-CM | POA: Diagnosis not present

## 2018-02-14 DIAGNOSIS — Z96651 Presence of right artificial knee joint: Secondary | ICD-10-CM | POA: Diagnosis not present

## 2018-02-14 DIAGNOSIS — E785 Hyperlipidemia, unspecified: Secondary | ICD-10-CM | POA: Diagnosis not present

## 2018-02-14 DIAGNOSIS — F329 Major depressive disorder, single episode, unspecified: Secondary | ICD-10-CM | POA: Diagnosis not present

## 2018-02-14 DIAGNOSIS — Z471 Aftercare following joint replacement surgery: Secondary | ICD-10-CM | POA: Diagnosis not present

## 2018-02-14 DIAGNOSIS — K862 Cyst of pancreas: Secondary | ICD-10-CM | POA: Diagnosis not present

## 2018-02-15 DIAGNOSIS — Z471 Aftercare following joint replacement surgery: Secondary | ICD-10-CM | POA: Diagnosis not present

## 2018-02-15 DIAGNOSIS — F419 Anxiety disorder, unspecified: Secondary | ICD-10-CM | POA: Diagnosis not present

## 2018-02-15 DIAGNOSIS — F329 Major depressive disorder, single episode, unspecified: Secondary | ICD-10-CM | POA: Diagnosis not present

## 2018-02-15 DIAGNOSIS — K862 Cyst of pancreas: Secondary | ICD-10-CM | POA: Diagnosis not present

## 2018-02-15 DIAGNOSIS — E785 Hyperlipidemia, unspecified: Secondary | ICD-10-CM | POA: Diagnosis not present

## 2018-02-15 DIAGNOSIS — Z96651 Presence of right artificial knee joint: Secondary | ICD-10-CM | POA: Diagnosis not present

## 2018-02-17 DIAGNOSIS — K862 Cyst of pancreas: Secondary | ICD-10-CM | POA: Diagnosis not present

## 2018-02-17 DIAGNOSIS — E785 Hyperlipidemia, unspecified: Secondary | ICD-10-CM | POA: Diagnosis not present

## 2018-02-17 DIAGNOSIS — Z471 Aftercare following joint replacement surgery: Secondary | ICD-10-CM | POA: Diagnosis not present

## 2018-02-17 DIAGNOSIS — F329 Major depressive disorder, single episode, unspecified: Secondary | ICD-10-CM | POA: Diagnosis not present

## 2018-02-17 DIAGNOSIS — F419 Anxiety disorder, unspecified: Secondary | ICD-10-CM | POA: Diagnosis not present

## 2018-02-17 DIAGNOSIS — Z96651 Presence of right artificial knee joint: Secondary | ICD-10-CM | POA: Diagnosis not present

## 2018-02-20 DIAGNOSIS — F419 Anxiety disorder, unspecified: Secondary | ICD-10-CM | POA: Diagnosis not present

## 2018-02-20 DIAGNOSIS — E785 Hyperlipidemia, unspecified: Secondary | ICD-10-CM | POA: Diagnosis not present

## 2018-02-20 DIAGNOSIS — Z96651 Presence of right artificial knee joint: Secondary | ICD-10-CM | POA: Diagnosis not present

## 2018-02-20 DIAGNOSIS — Z471 Aftercare following joint replacement surgery: Secondary | ICD-10-CM | POA: Diagnosis not present

## 2018-02-20 DIAGNOSIS — K862 Cyst of pancreas: Secondary | ICD-10-CM | POA: Diagnosis not present

## 2018-02-20 DIAGNOSIS — F329 Major depressive disorder, single episode, unspecified: Secondary | ICD-10-CM | POA: Diagnosis not present

## 2018-02-22 DIAGNOSIS — E785 Hyperlipidemia, unspecified: Secondary | ICD-10-CM | POA: Diagnosis not present

## 2018-02-22 DIAGNOSIS — Z96651 Presence of right artificial knee joint: Secondary | ICD-10-CM | POA: Diagnosis not present

## 2018-02-22 DIAGNOSIS — K862 Cyst of pancreas: Secondary | ICD-10-CM | POA: Diagnosis not present

## 2018-02-22 DIAGNOSIS — F419 Anxiety disorder, unspecified: Secondary | ICD-10-CM | POA: Diagnosis not present

## 2018-02-22 DIAGNOSIS — Z471 Aftercare following joint replacement surgery: Secondary | ICD-10-CM | POA: Diagnosis not present

## 2018-02-22 DIAGNOSIS — F329 Major depressive disorder, single episode, unspecified: Secondary | ICD-10-CM | POA: Diagnosis not present

## 2018-02-23 DIAGNOSIS — M1711 Unilateral primary osteoarthritis, right knee: Secondary | ICD-10-CM | POA: Diagnosis not present

## 2018-02-24 DIAGNOSIS — F419 Anxiety disorder, unspecified: Secondary | ICD-10-CM | POA: Diagnosis not present

## 2018-02-24 DIAGNOSIS — Z471 Aftercare following joint replacement surgery: Secondary | ICD-10-CM | POA: Diagnosis not present

## 2018-02-24 DIAGNOSIS — F329 Major depressive disorder, single episode, unspecified: Secondary | ICD-10-CM | POA: Diagnosis not present

## 2018-02-24 DIAGNOSIS — E785 Hyperlipidemia, unspecified: Secondary | ICD-10-CM | POA: Diagnosis not present

## 2018-02-24 DIAGNOSIS — K862 Cyst of pancreas: Secondary | ICD-10-CM | POA: Diagnosis not present

## 2018-02-24 DIAGNOSIS — Z96651 Presence of right artificial knee joint: Secondary | ICD-10-CM | POA: Diagnosis not present

## 2018-02-27 DIAGNOSIS — Z96651 Presence of right artificial knee joint: Secondary | ICD-10-CM | POA: Diagnosis not present

## 2018-02-27 DIAGNOSIS — Z471 Aftercare following joint replacement surgery: Secondary | ICD-10-CM | POA: Diagnosis not present

## 2018-02-27 DIAGNOSIS — F419 Anxiety disorder, unspecified: Secondary | ICD-10-CM | POA: Diagnosis not present

## 2018-02-27 DIAGNOSIS — K862 Cyst of pancreas: Secondary | ICD-10-CM | POA: Diagnosis not present

## 2018-02-27 DIAGNOSIS — E785 Hyperlipidemia, unspecified: Secondary | ICD-10-CM | POA: Diagnosis not present

## 2018-02-27 DIAGNOSIS — F329 Major depressive disorder, single episode, unspecified: Secondary | ICD-10-CM | POA: Diagnosis not present

## 2018-03-07 DIAGNOSIS — H903 Sensorineural hearing loss, bilateral: Secondary | ICD-10-CM | POA: Diagnosis not present

## 2018-03-13 DIAGNOSIS — Z96651 Presence of right artificial knee joint: Secondary | ICD-10-CM | POA: Diagnosis not present

## 2018-03-20 DIAGNOSIS — R112 Nausea with vomiting, unspecified: Secondary | ICD-10-CM | POA: Diagnosis not present

## 2018-03-20 DIAGNOSIS — Z800 Family history of malignant neoplasm of digestive organs: Secondary | ICD-10-CM | POA: Diagnosis not present

## 2018-03-20 DIAGNOSIS — R194 Change in bowel habit: Secondary | ICD-10-CM | POA: Diagnosis not present

## 2018-03-20 DIAGNOSIS — D136 Benign neoplasm of pancreas: Secondary | ICD-10-CM | POA: Diagnosis not present

## 2018-03-21 DIAGNOSIS — Z9189 Other specified personal risk factors, not elsewhere classified: Secondary | ICD-10-CM | POA: Diagnosis not present

## 2018-03-28 DIAGNOSIS — Z1231 Encounter for screening mammogram for malignant neoplasm of breast: Secondary | ICD-10-CM | POA: Diagnosis not present

## 2018-03-28 DIAGNOSIS — Z780 Asymptomatic menopausal state: Secondary | ICD-10-CM | POA: Diagnosis not present

## 2018-05-17 DIAGNOSIS — Z96651 Presence of right artificial knee joint: Secondary | ICD-10-CM | POA: Diagnosis not present

## 2018-05-22 DIAGNOSIS — Z471 Aftercare following joint replacement surgery: Secondary | ICD-10-CM | POA: Diagnosis not present

## 2018-05-22 DIAGNOSIS — Z96651 Presence of right artificial knee joint: Secondary | ICD-10-CM | POA: Diagnosis not present

## 2018-05-22 DIAGNOSIS — M25561 Pain in right knee: Secondary | ICD-10-CM | POA: Diagnosis not present

## 2018-06-28 DIAGNOSIS — M8580 Other specified disorders of bone density and structure, unspecified site: Secondary | ICD-10-CM | POA: Diagnosis not present

## 2018-07-06 DIAGNOSIS — M545 Low back pain: Secondary | ICD-10-CM | POA: Diagnosis not present

## 2018-07-06 DIAGNOSIS — M25571 Pain in right ankle and joints of right foot: Secondary | ICD-10-CM | POA: Diagnosis not present

## 2018-07-06 DIAGNOSIS — M5416 Radiculopathy, lumbar region: Secondary | ICD-10-CM | POA: Diagnosis not present

## 2018-07-06 DIAGNOSIS — M4156 Other secondary scoliosis, lumbar region: Secondary | ICD-10-CM | POA: Diagnosis not present

## 2018-07-07 DIAGNOSIS — M5416 Radiculopathy, lumbar region: Secondary | ICD-10-CM | POA: Diagnosis not present

## 2018-07-10 DIAGNOSIS — M5416 Radiculopathy, lumbar region: Secondary | ICD-10-CM | POA: Diagnosis not present

## 2018-07-13 DIAGNOSIS — M5416 Radiculopathy, lumbar region: Secondary | ICD-10-CM | POA: Diagnosis not present

## 2018-07-17 DIAGNOSIS — M5416 Radiculopathy, lumbar region: Secondary | ICD-10-CM | POA: Diagnosis not present

## 2018-07-19 DIAGNOSIS — H25813 Combined forms of age-related cataract, bilateral: Secondary | ICD-10-CM | POA: Diagnosis not present

## 2018-07-20 DIAGNOSIS — M5416 Radiculopathy, lumbar region: Secondary | ICD-10-CM | POA: Diagnosis not present

## 2018-07-24 DIAGNOSIS — M5416 Radiculopathy, lumbar region: Secondary | ICD-10-CM | POA: Diagnosis not present

## 2018-08-04 DIAGNOSIS — E782 Mixed hyperlipidemia: Secondary | ICD-10-CM | POA: Diagnosis not present

## 2018-08-11 DIAGNOSIS — Z0001 Encounter for general adult medical examination with abnormal findings: Secondary | ICD-10-CM | POA: Diagnosis not present

## 2018-08-11 DIAGNOSIS — F419 Anxiety disorder, unspecified: Secondary | ICD-10-CM | POA: Diagnosis not present

## 2018-08-11 DIAGNOSIS — E782 Mixed hyperlipidemia: Secondary | ICD-10-CM | POA: Diagnosis not present

## 2018-08-11 DIAGNOSIS — Z1159 Encounter for screening for other viral diseases: Secondary | ICD-10-CM | POA: Diagnosis not present

## 2018-08-17 DIAGNOSIS — M545 Low back pain: Secondary | ICD-10-CM | POA: Diagnosis not present

## 2018-10-04 DIAGNOSIS — R399 Unspecified symptoms and signs involving the genitourinary system: Secondary | ICD-10-CM | POA: Diagnosis not present

## 2018-10-04 DIAGNOSIS — R1030 Lower abdominal pain, unspecified: Secondary | ICD-10-CM | POA: Diagnosis not present

## 2018-10-16 DIAGNOSIS — Z800 Family history of malignant neoplasm of digestive organs: Secondary | ICD-10-CM | POA: Diagnosis not present

## 2018-10-16 DIAGNOSIS — D136 Benign neoplasm of pancreas: Secondary | ICD-10-CM | POA: Diagnosis not present

## 2018-10-16 DIAGNOSIS — R1032 Left lower quadrant pain: Secondary | ICD-10-CM | POA: Diagnosis not present

## 2018-10-18 DIAGNOSIS — Z23 Encounter for immunization: Secondary | ICD-10-CM | POA: Diagnosis not present

## 2018-10-26 DIAGNOSIS — I708 Atherosclerosis of other arteries: Secondary | ICD-10-CM | POA: Diagnosis not present

## 2018-10-26 DIAGNOSIS — K5790 Diverticulosis of intestine, part unspecified, without perforation or abscess without bleeding: Secondary | ICD-10-CM | POA: Diagnosis not present

## 2018-10-26 DIAGNOSIS — K862 Cyst of pancreas: Secondary | ICD-10-CM | POA: Diagnosis not present

## 2018-10-26 DIAGNOSIS — R1032 Left lower quadrant pain: Secondary | ICD-10-CM | POA: Diagnosis not present

## 2018-11-01 DIAGNOSIS — K862 Cyst of pancreas: Secondary | ICD-10-CM | POA: Diagnosis not present

## 2018-11-01 DIAGNOSIS — R1032 Left lower quadrant pain: Secondary | ICD-10-CM | POA: Diagnosis not present

## 2018-11-01 DIAGNOSIS — K5900 Constipation, unspecified: Secondary | ICD-10-CM | POA: Diagnosis not present

## 2018-11-24 DIAGNOSIS — N8110 Cystocele, unspecified: Secondary | ICD-10-CM | POA: Diagnosis not present

## 2018-11-24 DIAGNOSIS — R102 Pelvic and perineal pain: Secondary | ICD-10-CM | POA: Diagnosis not present

## 2018-12-05 DIAGNOSIS — Z20828 Contact with and (suspected) exposure to other viral communicable diseases: Secondary | ICD-10-CM | POA: Diagnosis not present

## 2018-12-29 DIAGNOSIS — M8580 Other specified disorders of bone density and structure, unspecified site: Secondary | ICD-10-CM | POA: Diagnosis not present

## 2018-12-29 DIAGNOSIS — N952 Postmenopausal atrophic vaginitis: Secondary | ICD-10-CM | POA: Diagnosis not present

## 2018-12-29 DIAGNOSIS — R1032 Left lower quadrant pain: Secondary | ICD-10-CM | POA: Diagnosis not present

## 2018-12-29 DIAGNOSIS — R102 Pelvic and perineal pain: Secondary | ICD-10-CM | POA: Diagnosis not present

## 2019-01-23 DIAGNOSIS — M62838 Other muscle spasm: Secondary | ICD-10-CM | POA: Diagnosis not present

## 2019-01-23 DIAGNOSIS — R1032 Left lower quadrant pain: Secondary | ICD-10-CM | POA: Diagnosis not present

## 2019-01-23 DIAGNOSIS — M545 Low back pain: Secondary | ICD-10-CM | POA: Diagnosis not present

## 2019-01-25 DIAGNOSIS — M62838 Other muscle spasm: Secondary | ICD-10-CM | POA: Diagnosis not present

## 2019-01-25 DIAGNOSIS — M545 Low back pain: Secondary | ICD-10-CM | POA: Diagnosis not present

## 2019-01-25 DIAGNOSIS — R1032 Left lower quadrant pain: Secondary | ICD-10-CM | POA: Diagnosis not present

## 2019-01-30 DIAGNOSIS — R1032 Left lower quadrant pain: Secondary | ICD-10-CM | POA: Diagnosis not present

## 2019-01-30 DIAGNOSIS — M545 Low back pain: Secondary | ICD-10-CM | POA: Diagnosis not present

## 2019-01-30 DIAGNOSIS — M62838 Other muscle spasm: Secondary | ICD-10-CM | POA: Diagnosis not present

## 2019-02-05 DIAGNOSIS — E782 Mixed hyperlipidemia: Secondary | ICD-10-CM | POA: Diagnosis not present

## 2019-02-05 DIAGNOSIS — Z1159 Encounter for screening for other viral diseases: Secondary | ICD-10-CM | POA: Diagnosis not present

## 2019-02-05 DIAGNOSIS — M62838 Other muscle spasm: Secondary | ICD-10-CM | POA: Diagnosis not present

## 2019-02-05 DIAGNOSIS — M545 Low back pain: Secondary | ICD-10-CM | POA: Diagnosis not present

## 2019-02-07 DIAGNOSIS — M545 Low back pain: Secondary | ICD-10-CM | POA: Diagnosis not present

## 2019-02-07 DIAGNOSIS — M62838 Other muscle spasm: Secondary | ICD-10-CM | POA: Diagnosis not present

## 2019-02-12 DIAGNOSIS — E782 Mixed hyperlipidemia: Secondary | ICD-10-CM | POA: Diagnosis not present

## 2019-02-12 DIAGNOSIS — F419 Anxiety disorder, unspecified: Secondary | ICD-10-CM | POA: Diagnosis not present

## 2019-02-14 DIAGNOSIS — M545 Low back pain: Secondary | ICD-10-CM | POA: Diagnosis not present

## 2019-02-14 DIAGNOSIS — M62838 Other muscle spasm: Secondary | ICD-10-CM | POA: Diagnosis not present

## 2019-02-16 DIAGNOSIS — M62838 Other muscle spasm: Secondary | ICD-10-CM | POA: Diagnosis not present

## 2019-02-16 DIAGNOSIS — M545 Low back pain: Secondary | ICD-10-CM | POA: Diagnosis not present

## 2019-02-21 DIAGNOSIS — M62838 Other muscle spasm: Secondary | ICD-10-CM | POA: Diagnosis not present

## 2019-02-21 DIAGNOSIS — M545 Low back pain: Secondary | ICD-10-CM | POA: Diagnosis not present

## 2019-03-01 DIAGNOSIS — R399 Unspecified symptoms and signs involving the genitourinary system: Secondary | ICD-10-CM | POA: Diagnosis not present

## 2019-03-01 DIAGNOSIS — N3001 Acute cystitis with hematuria: Secondary | ICD-10-CM | POA: Diagnosis not present

## 2019-03-16 DIAGNOSIS — E782 Mixed hyperlipidemia: Secondary | ICD-10-CM | POA: Diagnosis not present

## 2019-03-16 DIAGNOSIS — M545 Low back pain: Secondary | ICD-10-CM | POA: Diagnosis not present

## 2019-03-16 DIAGNOSIS — R42 Dizziness and giddiness: Secondary | ICD-10-CM | POA: Diagnosis not present

## 2019-03-16 DIAGNOSIS — R400 Somnolence: Secondary | ICD-10-CM | POA: Diagnosis not present

## 2019-03-20 DIAGNOSIS — Z23 Encounter for immunization: Secondary | ICD-10-CM | POA: Diagnosis not present

## 2019-03-22 DIAGNOSIS — Z87440 Personal history of urinary (tract) infections: Secondary | ICD-10-CM | POA: Diagnosis not present

## 2019-03-22 DIAGNOSIS — M545 Low back pain: Secondary | ICD-10-CM | POA: Diagnosis not present

## 2019-03-22 DIAGNOSIS — E782 Mixed hyperlipidemia: Secondary | ICD-10-CM | POA: Diagnosis not present

## 2019-04-04 DIAGNOSIS — R5383 Other fatigue: Secondary | ICD-10-CM | POA: Diagnosis not present

## 2019-04-04 DIAGNOSIS — Z862 Personal history of diseases of the blood and blood-forming organs and certain disorders involving the immune mechanism: Secondary | ICD-10-CM | POA: Diagnosis not present

## 2019-05-10 IMAGING — RF DG ESOPHAGUS
10 series · 15 of 24 positions shown · non-contrast
Comparison: None

CLINICAL DATA: Solid food dysphagia in throat for 2 months, nausea,
on reflux medication

EXAM:
ESOPHOGRAM / BARIUM SWALLOW / BARIUM TABLET STUDY
TECHNIQUE: Combined double contrast and single contrast examination performed
using effervescent crystals, thick barium liquid, and thin barium
liquid. The patient was observed with fluoroscopy swallowing a 13 mm
barium sulphate tablet.
FLUOROSCOPY TIME:  Fluoroscopy Time:  1 minutes 18 seconds
Radiation Exposure Index (if provided by the fluoroscopic device):
19.0 mGy
Number of Acquired Spot Images: multiple fluoroscopic screen
captures

[Series 1: cp_standard · 0.18mm/px · 2 of 64 frames shown (1 of 10)]
[frame 10/64]
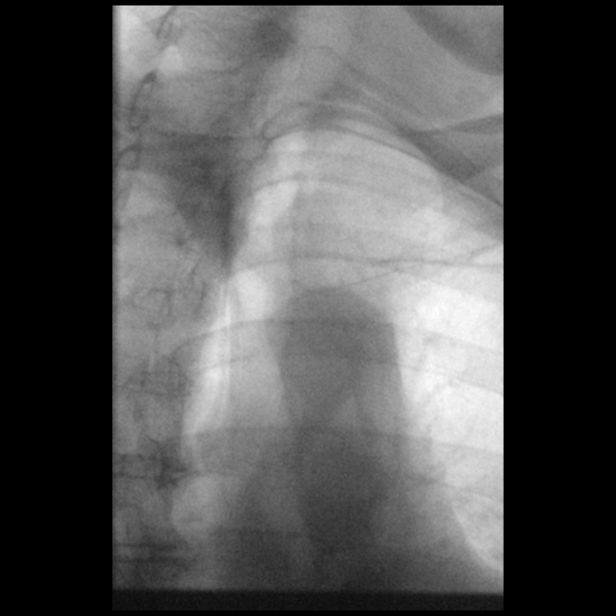
[frame 55/64]
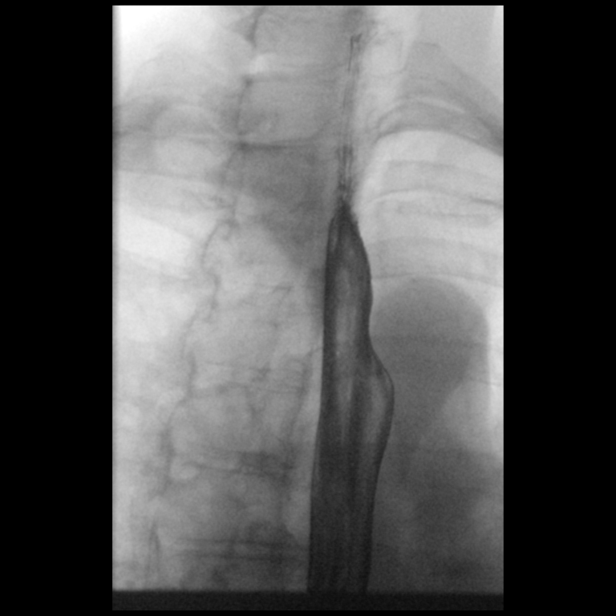

[Series 2: cp_standard · 0.18mm/px · 1 of 105 frames shown (2 of 10)]
[frame 90/105]
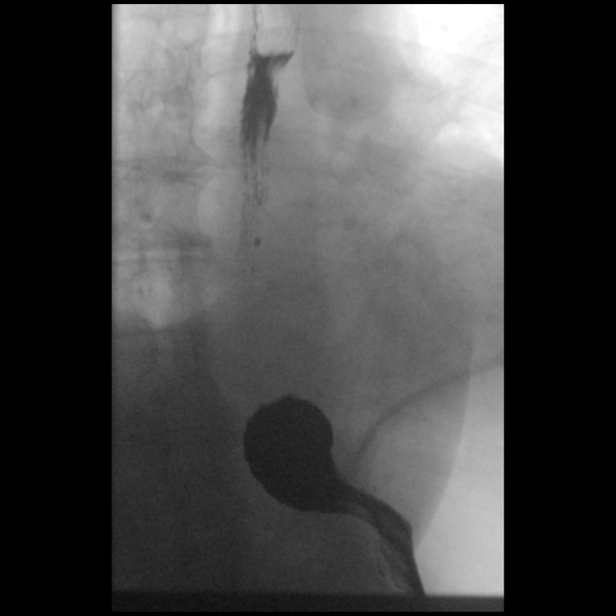

[Series 3: cp_standard · 0.18mm/px · 1 of 53 frames shown (3 of 10)]
[frame 8/53]
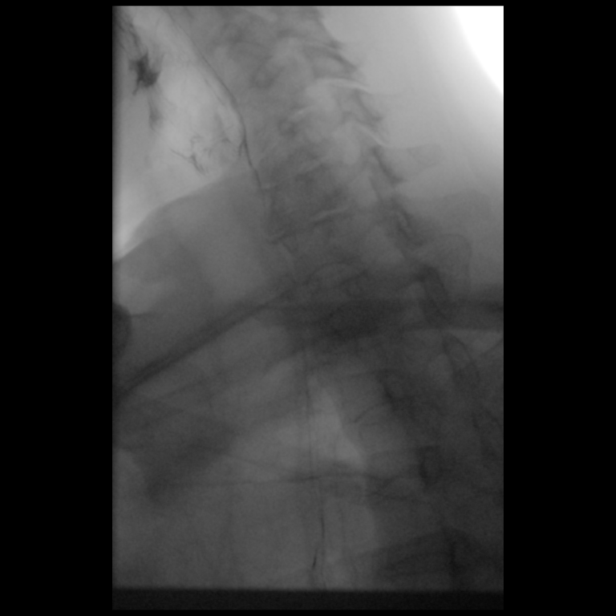

[Series 4: cp_standard · 0.19mm/px · 2 of 107 frames shown (4 of 10)]
[frame 17/107]
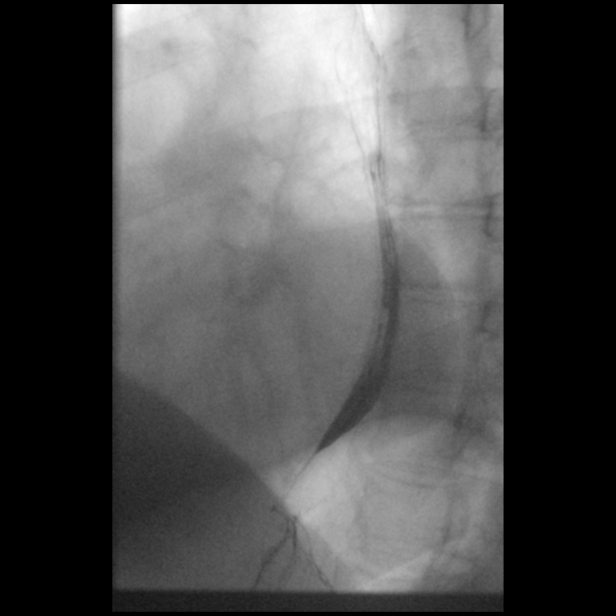
[frame 54/107]
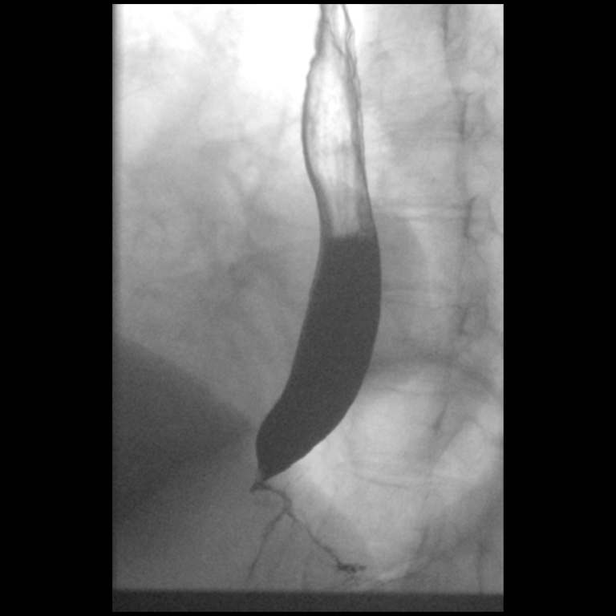

[Series 5: cp_standard · 0.29mm/px · 1 of 272 frames shown (5 of 10)]
[frame 73/272]
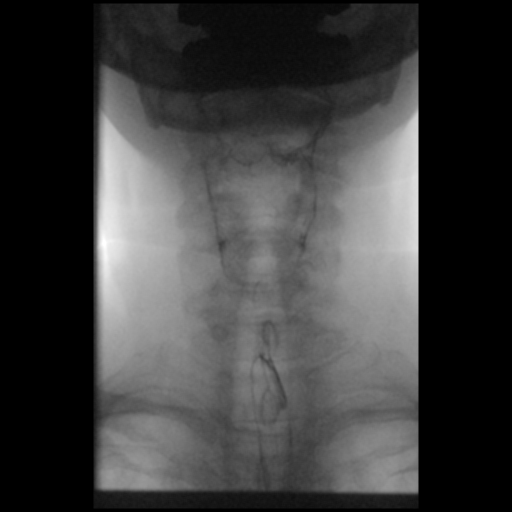

[Series 6: cp_standard · 0.27mm/px · 2 of 158 frames shown (6 of 10)]
[frame 24/158]
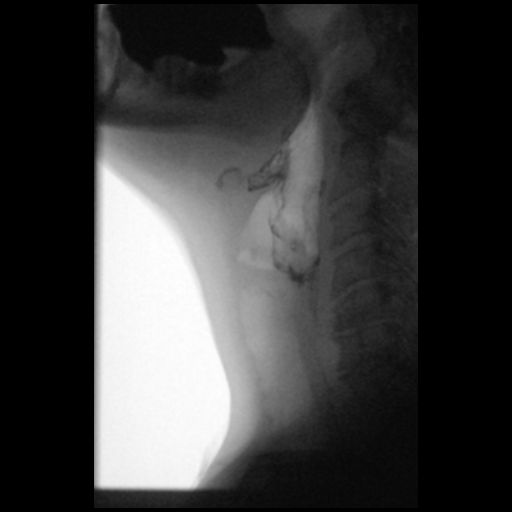
[frame 80/158]
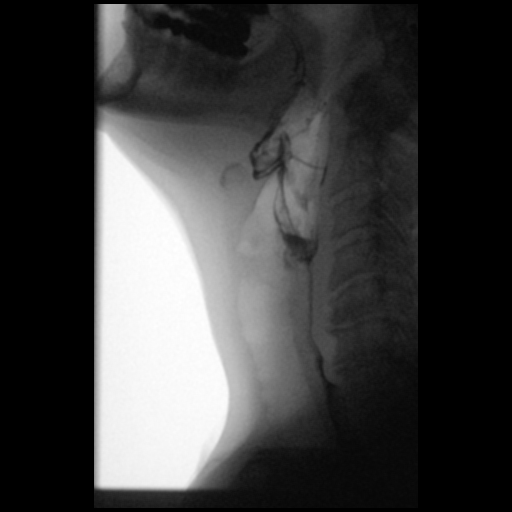

[Series 7: cp_standard · 0.19mm/px · 2 of 19 frames shown (7 of 10)]
[frame 4/19]
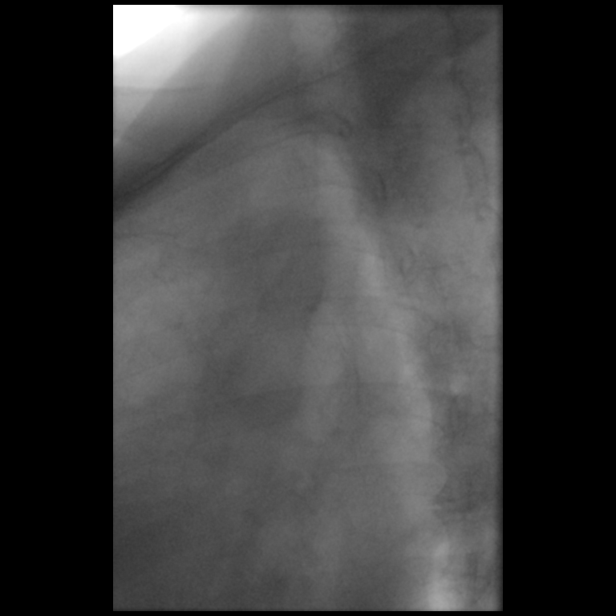
[frame 10/19]
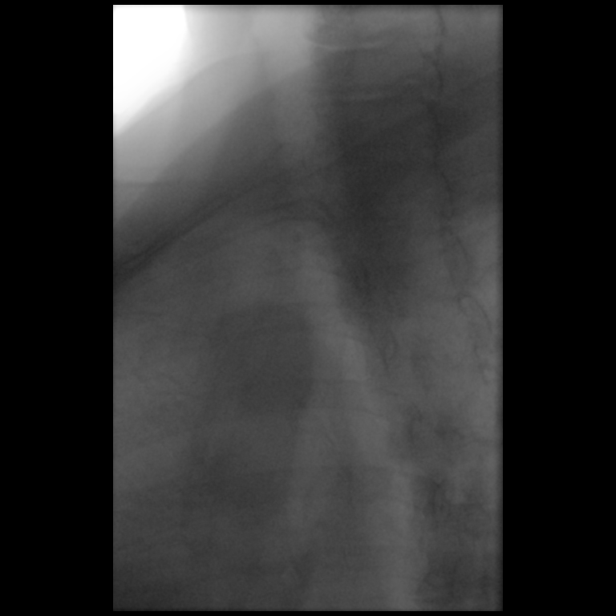

[Series 8: cp_standard · 0.19mm/px · 1 of 36 frames shown (8 of 10)]
[frame 19/36]
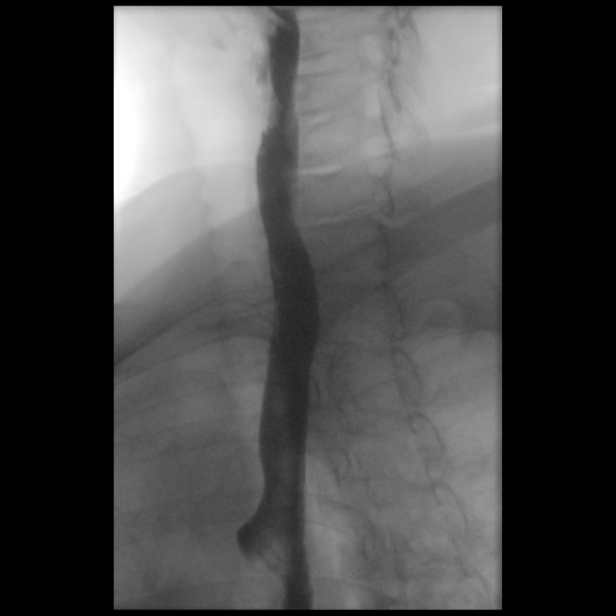

[Series 9: cp_standard · 0.19mm/px · 2 of 24 frames shown (9 of 10)]
[frame 6/24]
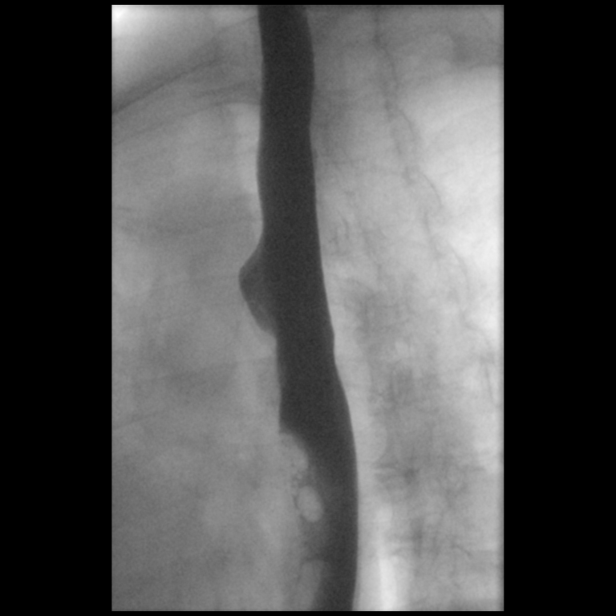
[frame 21/24]
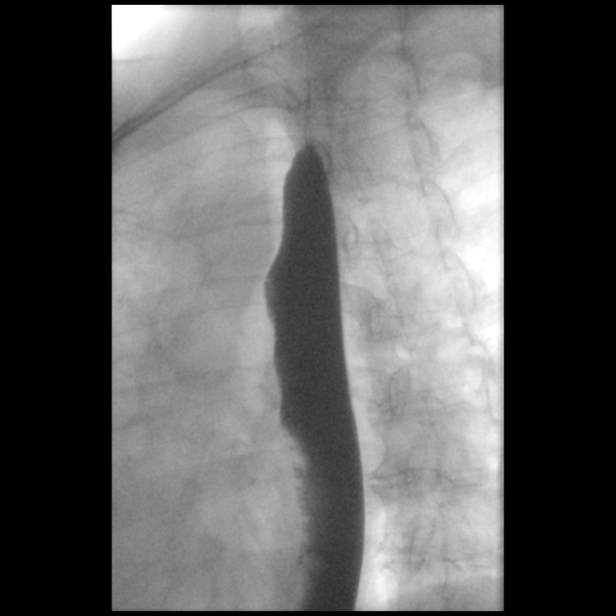

[Series 10: cp_standard · 0.19mm/px · 1 of 30 frames shown (10 of 10)]
[frame 26/30]
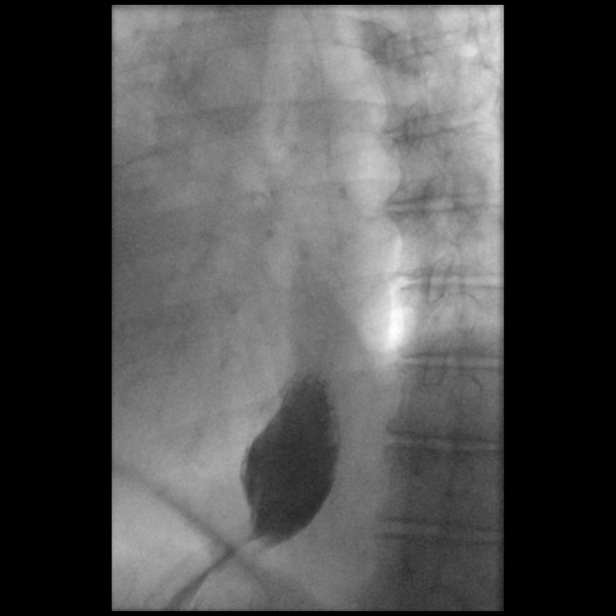

[15 of 24 positions shown; findings below may reference images not displayed]

FINDINGS: Esophageal distention: Normal distention without mass or stricture

Filling defects:  None

12.5 mm barium tablet: Passed from oral cavity into stomach without
delay

Motility:  Normal

Mucosa:  Smooth without irregularity or ulceration

Hypopharynx/cervical esophagus: Laryngeal penetration without
aspiration. Minimal residuals within the vallecula and piriform
sinuses cleared by a second swallow.

Hiatal hernia:  Absent

GE reflux:  Nonvisualized during exam

Other:  N/A
IMPRESSION: Laryngeal penetration without aspiration.

Minimal vallecular and piriform sinus residuals which were cleared
by a second swallow.

Remainder of exam unremarkable.

## 2019-06-20 LAB — HEMOGLOBIN A1C
Estimated Avg Glucose, External: 104 mg/dL (ref 91–123)
Hemoglobin A1C, External: 5.3 % (ref 4.8–5.6)

## 2019-10-17 ENCOUNTER — Encounter

## 2019-10-26 ENCOUNTER — Inpatient Hospital Stay: Admit: 2019-10-26 | Payer: MEDICARE | Attending: Gastroenterology | Primary: Family

## 2019-10-26 DIAGNOSIS — D136 Benign neoplasm of pancreas: Secondary | ICD-10-CM

## 2019-10-26 MED ORDER — GADOBUTROL 1 MMOL/ML (604.72 MG/ML) IV
1 mmol/mL (604.72 mg/mL) | Freq: Once | INTRAVENOUS | Status: AC
Start: 2019-10-26 — End: 2019-10-26
  Administered 2019-10-26: 17:00:00 via INTRAVENOUS

## 2019-10-26 MED FILL — GADAVIST 1 MMOL/ML (604.72 MG/ML) INTRAVENOUS SOLUTION: 1 mmol/mL (604.72 mg/mL) | INTRAVENOUS | Qty: 6

## 2020-11-10 ENCOUNTER — Encounter

## 2020-11-14 ENCOUNTER — Inpatient Hospital Stay: Payer: MEDICARE | Attending: Family Medicine | Primary: Family

## 2020-11-14 LAB — AMB POC CREATININE: Creatinine: 0.6 mg/dl (ref 0.6–1.3)

## 2020-11-14 LAB — CREATININE, POC: Creatinine: 0.6 mg/dl (ref 0.6–1.3)

## 2020-11-14 MED ORDER — BARIUM SULFATE 2 % ORAL SUSP
2.1 % (w/v), 2.0 % (w/w) | Freq: Once | ORAL | Status: AC
Start: 2020-11-14 — End: 2020-11-14
  Administered 2020-11-14: 18:00:00 via ORAL

## 2020-11-14 MED ORDER — IOPAMIDOL 61 % IV SOLN
300 mg iodine /mL (61 %) | Freq: Once | INTRAVENOUS | Status: AC
Start: 2020-11-14 — End: 2020-11-14
  Administered 2020-11-14: 18:00:00 via INTRAVENOUS

## 2020-11-14 MED FILL — BARIUM SULFATE 2 % ORAL SUSP: 2.1 % (w/v), 2.0 % (w/w) | ORAL | Qty: 900

## 2020-11-14 MED FILL — ISOVUE-300  61 % INTRAVENOUS SOLUTION: 300 mg iodine /mL (61 %) | INTRAVENOUS | Qty: 80

## 2020-12-16 ENCOUNTER — Encounter

## 2020-12-22 ENCOUNTER — Encounter

## 2020-12-22 ENCOUNTER — Inpatient Hospital Stay: Admit: 2020-12-22 | Payer: MEDICARE | Attending: Surgical Oncology | Primary: Family Medicine

## 2020-12-22 DIAGNOSIS — K862 Cyst of pancreas: Secondary | ICD-10-CM

## 2020-12-22 MED ORDER — GADOBUTROL 1 MMOL/ML (604.72 MG/ML) IV
1 mmol/mL (604.72 mg/mL) | Freq: Once | INTRAVENOUS | Status: AC
Start: 2020-12-22 — End: 2020-12-22
  Administered 2020-12-22: 15:00:00 via INTRAVENOUS

## 2020-12-22 MED FILL — GADAVIST 1 MMOL/ML (604.72 MG/ML) INTRAVENOUS SOLUTION: 1 mmol/mL (604.72 mg/mL) | INTRAVENOUS | Qty: 6

## 2020-12-31 ENCOUNTER — Inpatient Hospital Stay: Admit: 2020-12-31 | Payer: MEDICARE | Attending: Physical Medicine & Rehabilitation | Primary: Family Medicine

## 2020-12-31 DIAGNOSIS — M48061 Spinal stenosis, lumbar region without neurogenic claudication: Secondary | ICD-10-CM

## 2020-12-31 DIAGNOSIS — M545 Low back pain, unspecified: Secondary | ICD-10-CM

## 2021-02-09 NOTE — Telephone Encounter (Signed)
Formatting of this note might be different from the original.  New pcp, spoke with pt.  Electronically signed by Marcelle Overlie S at 02/09/2021  2:15 PM EST

## 2021-02-11 ENCOUNTER — Encounter

## 2021-02-17 ENCOUNTER — Inpatient Hospital Stay: Admit: 2021-02-17 | Payer: MEDICARE | Attending: Family Medicine | Primary: Family Medicine

## 2021-03-17 ENCOUNTER — Encounter

## 2021-03-19 ENCOUNTER — Inpatient Hospital Stay: Admit: 2021-03-19 | Payer: MEDICARE | Primary: Family Medicine

## 2021-03-19 ENCOUNTER — Encounter

## 2021-03-19 DIAGNOSIS — E041 Nontoxic single thyroid nodule: Secondary | ICD-10-CM

## 2021-03-19 LAB — POCT CREATININE - BLOOD: Creatinine: 0.6 mg/dl (ref 0.6–1.3)

## 2021-03-19 MED ORDER — IOPAMIDOL 61 % IV SOLN
61 % | Freq: Once | INTRAVENOUS | Status: AC | PRN
Start: 2021-03-19 — End: 2021-03-19
  Administered 2021-03-19: 18:00:00 70 mL via INTRAVENOUS

## 2021-03-19 MED ORDER — BARIUM SULFATE 2 % PO SUSP
2 % | Freq: Once | ORAL | Status: AC | PRN
Start: 2021-03-19 — End: 2021-03-19
  Administered 2021-03-19: 18:00:00 900 mL via ORAL

## 2021-05-15 ENCOUNTER — Encounter

## 2021-06-02 ENCOUNTER — Inpatient Hospital Stay: Admit: 2021-06-02 | Payer: MEDICARE | Primary: Family Medicine

## 2021-06-02 DIAGNOSIS — E041 Nontoxic single thyroid nodule: Secondary | ICD-10-CM

## 2021-06-02 MED ORDER — SODIUM IODIDE I-123 3.7 MBQ PO CAPS
3.7 MBQ | Freq: Once | ORAL | Status: AC
Start: 2021-06-02 — End: 2021-06-02
  Administered 2021-06-02: 13:00:00 260 via ORAL

## 2021-06-02 MED FILL — SODIUM IODIDE I-123 3.7 MBQ PO CAPS: 3.7 MBQ | ORAL | Qty: 3

## 2021-06-03 ENCOUNTER — Inpatient Hospital Stay: Admit: 2021-06-03 | Payer: MEDICARE | Primary: Family Medicine

## 2021-10-30 NOTE — Telephone Encounter (Signed)
Formatting of this note is different from the original.  [place]     Reason for Visit:  LBP F/U    Has the patient had an injury to this body part since their last visit: '[]'$ Yes  '[x]'$  No  If yes, date of injury:  Details:        Is the patient requesting an injection? : '[]'$ Yes  '[x]'$  No    Has the patient had surgery on this body part previously, at [place] or outside of [place]?     '[]'$ Yes  '[x]'$  No   Procedure:       Date:    Physician:      INSURANCE UPDATE : NO CHANGES    Primary Secondary   Company Name     ID Number       Group Number     Claims Address        Subscriber?s Name       Subscriber?s DOB       Referral?         IS THIS A MEDICAID POLICY?  '[]'$ Yes  '[x]'$  No       Do you have any communication needs that we should be aware of?  '[]'$ Yes  '[x]'$ No  If yes, please specify:                  Appointment Date & Time:     11/24/2021 @ 2:20 PM  Electronically signed by Luretha Rued at 10/30/2021  1:54 PM EDT

## 2021-11-16 ENCOUNTER — Encounter

## 2021-11-26 ENCOUNTER — Inpatient Hospital Stay: Admit: 2021-11-26 | Payer: MEDICARE | Primary: Family Medicine

## 2021-11-26 DIAGNOSIS — K862 Cyst of pancreas: Secondary | ICD-10-CM

## 2021-11-26 MED ORDER — GADOBUTROL 1 MMOL/ML IV SOLN
1 MMOL/ML | Freq: Once | INTRAVENOUS | Status: AC | PRN
Start: 2021-11-26 — End: 2021-11-26
  Administered 2021-11-26: 15:00:00 7 mL via INTRAVENOUS

## 2021-12-29 ENCOUNTER — Encounter

## 2022-01-04 ENCOUNTER — Inpatient Hospital Stay: Admit: 2022-01-04 | Payer: MEDICARE | Primary: Family Medicine

## 2022-01-04 DIAGNOSIS — R42 Dizziness and giddiness: Secondary | ICD-10-CM

## 2022-09-10 ENCOUNTER — Encounter

## 2022-09-10 ENCOUNTER — Inpatient Hospital Stay: Admit: 2022-09-10 | Payer: MEDICARE | Primary: Family Medicine

## 2022-09-10 DIAGNOSIS — M62838 Other muscle spasm: Secondary | ICD-10-CM

## 2022-09-14 ENCOUNTER — Encounter

## 2022-09-17 ENCOUNTER — Inpatient Hospital Stay: Admit: 2022-09-17 | Payer: MEDICARE | Primary: Family Medicine

## 2022-09-17 DIAGNOSIS — M47812 Spondylosis without myelopathy or radiculopathy, cervical region: Secondary | ICD-10-CM

## 2022-10-18 ENCOUNTER — Encounter

## 2022-10-18 ENCOUNTER — Inpatient Hospital Stay: Admit: 2022-10-18 | Payer: MEDICARE | Primary: Family Medicine

## 2022-10-18 DIAGNOSIS — M4802 Spinal stenosis, cervical region: Secondary | ICD-10-CM

## 2022-11-25 ENCOUNTER — Inpatient Hospital Stay: Admit: 2022-11-25 | Payer: MEDICARE | Primary: Family Medicine

## 2022-11-25 ENCOUNTER — Encounter

## 2022-11-25 ENCOUNTER — Inpatient Hospital Stay: Admit: 2022-11-25 | Payer: MEDICARE | Attending: Neurological Surgery | Primary: Family Medicine

## 2022-11-25 DIAGNOSIS — Z01818 Encounter for other preprocedural examination: Secondary | ICD-10-CM

## 2022-11-25 LAB — EKG 12-LEAD
Atrial Rate: 76 {beats}/min
Calculated P Axis: 56 degrees
Calculated R Axis: -61 degrees
Calculated T Axis: 17 degrees
DIAGNOSIS, 93000: NORMAL
P-R Interval: 164 ms
Q-T Interval: 394 ms
QRS Duration: 96 ms
QTC Calculation (Bezet): 443 ms
Ventricular Rate: 76 {beats}/min

## 2022-11-25 LAB — CBC WITH AUTO DIFFERENTIAL
Basophils: 0.4 % (ref 0–3)
Eosinophils: 2.6 % (ref 0–5)
Hematocrit: 38.2 % (ref 35.0–47.0)
Hemoglobin: 12.6 g/dL (ref 11.0–16.0)
Immature Granulocytes %: 0.4 % (ref 0.0–3.0)
Lymphocytes: 25.9 % — ABNORMAL LOW (ref 28–48)
MCH: 28.1 pg (ref 25.4–34.6)
MCHC: 33 g/dL (ref 30.0–36.0)
MCV: 85.1 fL (ref 80.0–98.0)
MPV: 9.4 fL (ref 6.0–10.0)
Monocytes: 7.2 % (ref 1–13)
Neutrophils Segmented: 63.5 % (ref 34–64)
Nucleated RBCs: 0 (ref 0–0)
Platelets: 209 10*3/uL (ref 140–450)
RBC: 4.49 M/uL (ref 3.60–5.20)
RDW: 42.3 (ref 36.4–46.3)
WBC: 5 10*3/uL (ref 4.0–11.0)

## 2022-11-25 LAB — BASIC METABOLIC PANEL
Anion Gap: 5 mmol/L (ref 5–15)
BUN: 12 mg/dL (ref 9–23)
CO2: 28 meq/L (ref 20–31)
Calcium: 10.2 mg/dL (ref 8.7–10.4)
Chloride: 105 meq/L (ref 98–107)
Creatinine: 0.67 mg/dL (ref 0.55–1.02)
GFR African American: >60
GFR Non-African American: >60
Glucose: 95 mg/dL (ref 74–106)
Potassium: 4.3 meq/L (ref 3.5–5.1)
Sodium: 138 meq/L (ref 136–145)

## 2022-11-25 LAB — PROTIME-INR
INR: 1 (ref 0.1–1.1)
Protime: 11.5 s (ref 10.2–12.9)

## 2022-12-13 ENCOUNTER — Encounter: Admit: 2022-12-13 | Admitting: Family Medicine

## 2022-12-13 DIAGNOSIS — R109 Unspecified abdominal pain: Secondary | ICD-10-CM

## 2022-12-15 ENCOUNTER — Ambulatory Visit
Admit: 2022-12-15 | Discharge: 2022-12-16 | Disposition: A | Discharge status: Home / Self Care | Payer: MEDICARE | Attending: Family Medicine | Admitting: Family Medicine | Primary: Family Medicine

## 2022-12-15 ENCOUNTER — Encounter: Admit: 2022-12-15 | Admitting: Family Medicine

## 2022-12-15 DIAGNOSIS — R109 Unspecified abdominal pain: Secondary | ICD-10-CM

## 2022-12-15 DIAGNOSIS — D1771 Benign lipomatous neoplasm of kidney: Secondary | ICD-10-CM

## 2022-12-15 MED ORDER — IOPAMIDOL 61 % IV SOLN
61 | Freq: Once | INTRAVENOUS | Status: AC | PRN
Start: 2022-12-15 — End: 2022-12-15
  Administered 2022-12-15: 18:00:00 80 mL via INTRAVENOUS

## 2023-01-17 ENCOUNTER — Inpatient Hospital Stay
Admit: 2023-01-17 | Disposition: A | Discharge status: Home / Self Care | Payer: MEDICARE | Admitting: Neurological Surgery | Primary: Family Medicine

## 2023-01-17 DIAGNOSIS — Z01812 Encounter for preprocedural laboratory examination: Principal | ICD-10-CM

## 2023-01-17 LAB — ABO/RH: ABO/Rh: A POS

## 2023-01-17 LAB — ANTIBODY SCREEN: Antibody Screen: NEGATIVE

## 2023-01-27 ENCOUNTER — Inpatient Hospital Stay: Payer: MEDICARE | Attending: Neurological Surgery

## 2023-01-27 MED ORDER — ONDANSETRON HCL 4 MG/2ML IJ SOLN
4 | INTRAMUSCULAR | Status: AC
Start: 2023-01-27 — End: ?

## 2023-01-27 MED ORDER — LACTATED RINGERS IV SOLN
INTRAVENOUS | Status: DC
Start: 2023-01-27 — End: 2023-01-27

## 2023-01-27 MED ORDER — LIDOCAINE HCL (PF) 1 % IJ SOLN
1 | Freq: Once | INTRAMUSCULAR | Status: AC | PRN
Start: 2023-01-27 — End: 2023-01-27
  Administered 2023-01-27: 15:00:00 1 mL via INTRADERMAL

## 2023-01-27 MED ORDER — PROPOFOL 200 MG/20ML IV EMUL
200 | INTRAVENOUS | Status: AC
Start: 2023-01-27 — End: ?

## 2023-01-27 MED ORDER — SUGAMMADEX SODIUM 200 MG/2ML IV SOLN
200 | INTRAVENOUS | Status: AC
Start: 2023-01-27 — End: ?

## 2023-01-27 MED ORDER — ROCURONIUM BROMIDE 50 MG/5ML IV SOLN
50 | INTRAVENOUS | Status: AC
Start: 2023-01-27 — End: ?

## 2023-01-27 MED FILL — LACTATED RINGERS IV SOLN: INTRAVENOUS | Qty: 1000

## 2023-01-27 MED FILL — DIPRIVAN 200 MG/20ML IV EMUL: 200 MG/20ML | INTRAVENOUS | Qty: 20

## 2023-01-27 MED FILL — BRIDION 200 MG/2ML IV SOLN: 200 MG/2ML | INTRAVENOUS | Qty: 2

## 2023-01-27 MED FILL — ROCURONIUM BROMIDE 50 MG/5ML IV SOLN: 50 MG/5ML | INTRAVENOUS | Qty: 5

## 2023-01-27 MED FILL — ONDANSETRON HCL 4 MG/2ML IJ SOLN: 4 MG/2ML | INTRAMUSCULAR | Qty: 2

## 2023-01-27 NOTE — Progress Notes (Signed)
Spouse Breanne Moad present [phone].

## 2023-01-27 NOTE — Other (Signed)
 Pre-Operative Patient Rounding and Family Updates        [x]     Hourly rounding    [x]     Offered toileting    [x]     Call bell with in reach    Update given to:       [x]    Patient                []    Family     Update related to:                        [x]   Procedure time                []  Procedure delay                               Other:

## 2023-01-27 NOTE — Anesthesia Pre-Procedure Evaluation (Signed)
Department of Anesthesiology  Preprocedure Note       Name:  Tracy Middleton   Age:  78 y.o.  DOB:  [birth year]                                          MRN:  [id number]         Date:  01/27/2023      Surgeon: Moishe Spice):  Alfonso Patten, MD    Procedure: Procedure(s):  ANTERIOR CERVICAL DISCECTOMY C5-7; ARTHRODESIS INSTRUMENTATION & LOCAL BONE GRAFT    Medications prior to admission:   Prior to Admission medications    Medication Sig Start Date End Date Taking? Authorizing Provider   sertraline (ZOLOFT) 100 MG tablet Take 1 tablet by mouth daily   Yes [provider]   gabapentin (NEURONTIN) 300 MG capsule Take 1 capsule 3 times a day by oral route. 11/08/22  Yes [provider]   cetirizine (ZYRTEC) 10 MG tablet Take 1 tablet by mouth daily 04/05/22  Yes [provider]   baclofen (LIORESAL) 10 MG tablet Take 1 tablet by mouth 3 times daily as needed 12/24/21  Yes [provider]   alendronate (FOSAMAX) 70 MG tablet PLEASE SEE ATTACHED FOR DETAILED DIRECTIONS 09/17/22  Yes [provider]   prednisoLONE acetate (PRED FORTE) 1 % ophthalmic suspension INSTILL 1 DROP INTO LEFT EYE 4 TIMES A DAY STARTING 3 DAYS PRIOR TO PROCEDURE  Patient not taking: Reported on 01/27/2023    [provider]   omeprazole (PRILOSEC) 40 MG delayed release capsule TAKE 1 CAPSULE BY MOUTH ONCE A DAY, 20-30 MINUTES BEFORE SUPPER  Patient not taking: Reported on 01/27/2023    [provider]   Multiple Vitamins-Minerals (AIRBORNE) CHEW Take by mouth daily  Patient not taking: Reported on 01/27/2023    [provider]   ketorolac (ACULAR) 0.5 % ophthalmic solution INTILL 1 DROP INTO LEFT EYE TWICE A DAY STARTING 3 DAYS PRIOR TO SURGERY  Patient not taking: Reported on 01/27/2023    [provider]   Glucosamine Sulfate 1000 MG TABS Take by mouth daily  Patient not taking: Reported on 01/27/2023    [provider]   fezolinetant (VEOZAH) 45 MG TABS Take 1 tablet by  mouth daily  Patient not taking: Reported on 01/27/2023 09/16/22   [provider]   aspirin 81 MG EC tablet Take 1 tablet by mouth daily  Patient not taking: Reported on 01/27/2023    [provider]       Current medications:    No current facility-administered medications for this encounter.       Allergies:  No Known Allergies    Problem List:  There is no problem list on file for this patient.      Past Medical History:        Diagnosis Date    Arthritis     Hyperlipidemia        Past Surgical History:        Procedure Laterality Date    COLONOSCOPY      ENDOSCOPY, COLON, DIAGNOSTIC      FRACTURE SURGERY      JOINT REPLACEMENT         Social History:    Social History     Tobacco Use    Smoking status: Never    Smokeless tobacco: Never   Substance Use  Topics    Alcohol use: Yes     Alcohol/week: 1.0 standard drink of alcohol     Types: 1 Glasses of wine per week                                Counseling given: Not Answered      Vital Signs (Current):   Vitals:    01/27/23 0944   BP: 138/89   Pulse: 89   Resp: 18   Temp: 97.9 F (36.6 C)   TempSrc: Oral   SpO2: 98%   Weight: 65.9 kg (145 lb 4.5 oz)   Height: 1.524 m (5')                                              BP Readings from Last 3 Encounters:   01/27/23 138/89       NPO Status:                                                                                 BMI:   Wt Readings from Last 3 Encounters:   01/27/23 65.9 kg (145 lb 4.5 oz)   09/17/22 63.5 kg (140 lb)     Body mass index is 28.37 kg/m.    CBC:   Lab Results   Component Value Date/Time    WBC 5.0 11/25/2022 11:38 AM    RBC 4.49 11/25/2022 11:38 AM    HGB 12.6 11/25/2022 11:38 AM    HCT 38.2 11/25/2022 11:38 AM    MCV 85.1 11/25/2022 11:38 AM    RDW 42.3 11/25/2022 11:38 AM    PLT 209 11/25/2022 11:38 AM       CMP:   Lab Results   Component Value Date/Time    NA 138 11/25/2022 11:38 AM    K 4.3 11/25/2022 11:38 AM    CL 105 11/25/2022 11:38 AM    CO2 28 11/25/2022 11:38 AM    BUN 12  11/25/2022 11:38 AM    CREATININE 0.67 11/25/2022 11:38 AM    GFRAA >60.0 11/25/2022 11:38 AM    LABGLOM >60 11/25/2022 11:38 AM    GLUCOSE 95 11/25/2022 11:38 AM    CALCIUM 10.2 11/25/2022 11:38 AM       POC Tests: No results for input(s): "POCGLU", "POCNA", "POCK", "POCCL", "POCBUN", "POCHEMO", "POCHCT" in the last 72 hours.    Coags:   Lab Results   Component Value Date/Time    PROTIME 11.5 11/25/2022 11:38 AM    INR 1.0 11/25/2022 11:38 AM       HCG (If Applicable): No results found for: "PREGTESTUR", "PREGSERUM", "HCG", "HCGQUANT"     ABGs: No results found for: "PHART", "PO2ART", "PCO2ART", "HCO3ART", "BEART", "O2SATART"     Type & Screen (If Applicable):  Lab Results   Component Value Date    ABORH A Rh Positive 01/17/2023    LABANTI NEG 01/17/2023       Drug/Infectious Status (If Applicable):  No results found for: "HIV", "HEPCAB"  COVID-19 Screening (If Applicable): No results found for: "COVID19"        Anesthesia Evaluation  Patient summary reviewed   no history of anesthetic complications:   Airway: Mallampati: II  TM distance: >3 FB   Neck ROM: full  Mouth opening: > = 3 FB   Dental: normal exam         Pulmonary: breath sounds clear to auscultation      (-) asthma, recent URI and not a current smoker                           Cardiovascular:  Exercise tolerance: good (>4 METS)      (-) hypertension    ECG reviewed  Rhythm: regular  Rate: normal  Echocardiogram reviewed         Beta Blocker:  Not on Beta Blocker         Neuro/Psych:   (+) neuromuscular disease:   (-) seizures and CVA           GI/Hepatic/Renal:   (+) GERD: well controlled     (-) liver disease and no renal disease       Endo/Other:    (+) : arthritis:..    (-) diabetes mellitus, hypothyroidism               Abdominal:         (-) obese       Vascular:     - DVT.      Other Findings:             Anesthesia Plan      general     ASA 2       Induction: intravenous.    MIPS: Postoperative opioids intended and Prophylactic antiemetics  administered.  Anesthetic plan and risks discussed with patient.    Use of blood products discussed with patient whom consented to blood products.      Attending anesthesiologist reviewed and agrees with Preprocedure content                Myrtie Soman, MD   01/27/2023

## 2023-01-27 NOTE — Other (Signed)
 Pre-Operative Patient Rounding and Family Updates        [x]     Hourly rounding    [x]     Offered toileting    [x]     Call bell with in reach    Update given to:       [x]    Patient                [x]    Family     Update related to:                        [x]   Procedure time                []  Procedure delay                               Other:

## 2023-03-18 ENCOUNTER — Inpatient Hospital Stay: Payer: MEDICARE | Attending: Gastroenterology

## 2023-03-18 MED ORDER — LACTATED RINGERS IV SOLN
INTRAVENOUS | Status: DC
Start: 2023-03-18 — End: 2023-03-18
  Administered 2023-03-18: 14:00:00 via INTRAVENOUS

## 2023-03-18 MED ORDER — SODIUM CHLORIDE 0.9 % IV SOLN
0.9 | INTRAVENOUS | Status: DC | PRN
Start: 2023-03-18 — End: 2023-03-18

## 2023-03-18 MED ORDER — NORMAL SALINE FLUSH 0.9 % IV SOLN
0.9 | INTRAVENOUS | Status: DC | PRN
Start: 2023-03-18 — End: 2023-03-18

## 2023-03-18 MED ORDER — LIDOCAINE HCL (PF) 1 % IJ SOLN
1 | Freq: Once | INTRAMUSCULAR | Status: AC
Start: 2023-03-18 — End: 2023-03-18
  Administered 2023-03-18: 14:00:00 2 mL via INTRADERMAL

## 2023-03-18 MED ORDER — NORMAL SALINE FLUSH 0.9 % IV SOLN
0.9 | Freq: Two times a day (BID) | INTRAVENOUS | Status: DC
Start: 2023-03-18 — End: 2023-03-18

## 2023-03-18 MED ORDER — PROPOFOL 200 MG/20ML IV EMUL
20020 | Freq: Once | INTRAVENOUS | Status: DC | PRN
Start: 2023-03-18 — End: 2023-03-18
  Administered 2023-03-18: 15:00:00 70 via INTRAVENOUS
  Administered 2023-03-18: 15:00:00 10 via INTRAVENOUS
  Administered 2023-03-18: 15:00:00 30 via INTRAVENOUS
  Administered 2023-03-18 (×4): 20 via INTRAVENOUS

## 2023-03-18 MED ORDER — LIDOCAINE HCL 2 % IJ SOLN
2 | Freq: Once | INTRAMUSCULAR | Status: DC | PRN
Start: 2023-03-18 — End: 2023-03-18
  Administered 2023-03-18: 15:00:00 40 via INTRAVENOUS

## 2023-03-18 MED ORDER — PHENYLEPHRINE HCL (PRESSORS) 10 MG/ML IV SOLN
10 | Freq: Once | INTRAVENOUS | Status: DC | PRN
Start: 2023-03-18 — End: 2023-03-18
  Administered 2023-03-18: 15:00:00 100 via INTRAVENOUS

## 2023-03-18 MED FILL — LACTATED RINGERS IV SOLN: INTRAVENOUS | Qty: 1000

## 2023-03-18 MED FILL — BD POSIFLUSH 0.9 % IV SOLN: 0.9 % | INTRAVENOUS | Qty: 40

## 2023-03-18 NOTE — Discharge Instructions (Addendum)
 Your colonoscopy today revealed mild diverticulosis and internal hemorrhoids.  Your preparation was excellent.  You will need another colonoscopy in 5 years because you have your family history of colon cancer.  Have a great weekend.    Colonoscopy/Flexible Sigmoidoscopy   Lower Exam Discharge Instructions    Monitored Anesthesia Care (MAC)/Sedation     Do not drive or operate machinery for 24 hours.   Do not consume alcohol, tranquilizers, sleeping medications or any non- prescribed medication for 24 hours.  Do not make important decisions or sign any important papers in the next 24 hours.  You are advised to go directly home from the hospital. You should have someone with you tonight at home.    Activity    Restrict your activities and rest for a day. Resume light to normal activity tomorrow.  Because of air put into your colon during exam, you may experience some abdominal distension, relieved by the passage of gas, for several hours.     Fluids and Diet    You may resume regular diet and activity after exam unless otherwise specified by your physician.   Special diet instructions: ***    Medications  You may resume your normal daily prescription medication schedule unless otherwise specified by your physician.     Contact your physician if you have any of the following:     Excessive amount of bleeding - large amount when having a bowel movement. Occasional specks of blood with bowel movement would not be unusual.   Severe abdominal pain   Fever or Chills     IF YOU HAVE PROBLEMS THAT CONCERN YOU:    Call your doctor.    After office hours, you can reach your physician through their answering service.     IF YOU NEEDIMMEDIATE ATTENTION, CALL 911 OR GO TO THE NEAREST EMERGENCY DEPARTMENT. Our [facility] number is [phone]    Any additional instructions:

## 2023-03-18 NOTE — Anesthesia Pre-Procedure Evaluation (Signed)
 Department of Anesthesiology  Preprocedure Note       Name:  Tracy Middleton   Age:  78 y.o.  DOB:  August 31, 1945                                          MRN:  [id number]         Date:  03/18/2023      Surgeon: Moishe Spice):  Yisroel Ramming, MD    Procedure: Procedure(s):  SURVEILLANCE COLONOSCOPY - COLORECTAL CANCER SCREENING, HIGH RISK    Medications prior to admission:   Prior to Admission medications    Medication Sig Start Date End Date Taking? Authorizing Provider   sertraline (ZOLOFT) 100 MG tablet Take 1 tablet by mouth daily    [provider]   prednisoLONE acetate (PRED FORTE) 1 % ophthalmic suspension INSTILL 1 DROP INTO LEFT EYE 4 TIMES A DAY STARTING 3 DAYS PRIOR TO PROCEDURE  Patient not taking: Reported on 01/27/2023    [provider]   omeprazole (PRILOSEC) 40 MG delayed release capsule TAKE 1 CAPSULE BY MOUTH ONCE A DAY, 20-30 MINUTES BEFORE SUPPER  Patient not taking: Reported on 01/27/2023    [provider]   Multiple Vitamins-Minerals (AIRBORNE) CHEW Take by mouth daily  Patient not taking: Reported on 01/27/2023    [provider]   ketorolac (ACULAR) 0.5 % ophthalmic solution INTILL 1 DROP INTO LEFT EYE TWICE A DAY STARTING 3 DAYS PRIOR TO SURGERY  Patient not taking: Reported on 01/27/2023    [provider]   Glucosamine Sulfate 1000 MG TABS Take by mouth daily  Patient not taking: Reported on 01/27/2023    [provider]   gabapentin (NEURONTIN) 300 MG capsule Take 1 capsule 3 times a day by oral route. 11/08/22   [provider]   fezolinetant (VEOZAH) 45 MG TABS Take 1 tablet by mouth daily  Patient not taking: Reported on 01/27/2023 09/16/22   [provider]   cetirizine (ZYRTEC) 10 MG tablet Take 1 tablet by mouth daily 04/05/22   [provider]   baclofen (LIORESAL) 10 MG tablet Take 1 tablet by mouth 3 times daily as needed 12/24/21   [provider]   aspirin 81 MG EC tablet Take 1 tablet by mouth  daily  Patient not taking: Reported on 01/27/2023    [provider]   alendronate (FOSAMAX) 70 MG tablet PLEASE SEE ATTACHED FOR DETAILED DIRECTIONS 09/17/22   [provider]       Current medications:    No current facility-administered medications for this encounter.       Allergies:  No Known Allergies    Problem List:  There is no problem list on file for this patient.      Past Medical History:        Diagnosis Date   . Arthritis    . Hyperlipidemia        Past Surgical History:        Procedure Laterality Date   . COLONOSCOPY     . ENDOSCOPY, COLON, DIAGNOSTIC     . FRACTURE SURGERY     . JOINT REPLACEMENT         Social History:    Social History     Tobacco Use   . Smoking status: Never   . Smokeless tobacco: Never   Substance Use Topics   .  Alcohol use: Yes     Alcohol/week: 1.0 standard drink of alcohol     Types: 1 Glasses of wine per week                                Counseling given: Not Answered      Vital Signs (Current):   Vitals:    03/18/23 0808   BP: (!) 144/90   Pulse: 98   Resp: 18   Temp: 98.3 F (36.8 C)   TempSrc: Oral   SpO2: 96%   Weight: 66.6 kg (146 lb 13.2 oz)   Height: 1.499 m (4\' 11" )                                              BP Readings from Last 3 Encounters:   03/18/23 (!) 144/90   01/27/23 138/89       NPO Status:                                                                                 BMI:   Wt Readings from Last 3 Encounters:   03/18/23 66.6 kg (146 lb 13.2 oz)   01/27/23 65.9 kg (145 lb 4.5 oz)   09/17/22 63.5 kg (140 lb)     Body mass index is 29.66 kg/m.    CBC:   Lab Results   Component Value Date/Time    WBC 5.0 11/25/2022 11:38 AM    RBC 4.49 11/25/2022 11:38 AM    HGB 12.6 11/25/2022 11:38 AM    HCT 38.2 11/25/2022 11:38 AM    MCV 85.1 11/25/2022 11:38 AM    RDW 42.3 11/25/2022 11:38 AM    PLT 209 11/25/2022 11:38 AM       CMP:   Lab Results   Component Value Date/Time    NA 138 11/25/2022 11:38 AM    K 4.3 11/25/2022 11:38 AM    CL 105  11/25/2022 11:38 AM    CO2 28 11/25/2022 11:38 AM    BUN 12 11/25/2022 11:38 AM    CREATININE 0.67 11/25/2022 11:38 AM    GFRAA >60.0 11/25/2022 11:38 AM    LABGLOM >60 11/25/2022 11:38 AM    GLUCOSE 95 11/25/2022 11:38 AM    CALCIUM 10.2 11/25/2022 11:38 AM       POC Tests: No results for input(s): "POCGLU", "POCNA", "POCK", "POCCL", "POCBUN", "POCHEMO", "POCHCT" in the last 72 hours.    Coags:   Lab Results   Component Value Date/Time    PROTIME 11.5 11/25/2022 11:38 AM    INR 1.0 11/25/2022 11:38 AM       HCG (If Applicable): No results found for: "PREGTESTUR", "PREGSERUM", "HCG", "HCGQUANT"     ABGs: No results found for: "PHART", "PO2ART", "PCO2ART", "HCO3ART", "BEART", "O2SATART"     Type & Screen (If Applicable):  Lab Results   Component Value Date    ABORH A Rh Positive 01/17/2023    LABANTI NEG 01/17/2023       Drug/Infectious  Status (If Applicable):  No results found for: "HIV", "HEPCAB"    COVID-19 Screening (If Applicable): No results found for: "COVID19"        Anesthesia Evaluation  Patient summary reviewed and Nursing notes reviewed   no history of anesthetic complications:   Airway: Mallampati: II  TM distance: >3 FB     Mouth opening: > = 3 FB   Dental: normal exam         Pulmonary:Negative Pulmonary ROS and normal exam                               Cardiovascular:Negative CV ROS                      Neuro/Psych:   Negative Neuro/Psych ROS              GI/Hepatic/Renal:   (+) GERD:          Endo/Other: Negative Endo/Other ROS                    Abdominal: normal exam            Vascular:          Other Findings:       Anesthesia Plan      general     ASA 2       Induction: intravenous.    MIPS: Prophylactic antiemetics administered.  Anesthetic plan and risks discussed with patient.      Plan discussed with CRNA.    Attending anesthesiologist reviewed and agrees with Preprocedure content            Chalmers Guest, MD   03/18/2023

## 2023-03-18 NOTE — Other (Signed)
 Pre-Operative Patient Rounding and Family Updates        [x]     Hourly rounding    [x]     Offered toileting    [x]     Call bell with in reach    Update given to:       [x]    Patient                []    Family     Update related to:                        [x]   Procedure time                []  Procedure delay                               Other:

## 2023-03-18 NOTE — Progress Notes (Signed)
 Spouse Raquelle Pietro present/ driver [phone]

## 2023-03-18 NOTE — Anesthesia Post-Procedure Evaluation (Signed)
 Department of Anesthesiology  Postprocedure Note    Patient: Tracy Middleton  MRN: [id number]  Birthdate: Dec 16, 1945  Date of evaluation: 03/18/2023    Procedure Summary       Date: 03/18/23 Room / Location: [facility] ENDO 03 / [facility] ENDOSCOPY    Anesthesia Start: 0930 Anesthesia Stop: 0953    Procedure: SURVEILLANCE COLONOSCOPY - COLORECTAL CANCER SCREENING, HIGH RISK (Lower GI Region) Diagnosis:       Family hx of colon cancer      (Family hx of colon cancer [Z80.0])    Surgeons: Yisroel Ramming, MD Responsible Provider: Chalmers Guest, MD    Anesthesia Type: General ASA Status: 2            Anesthesia Type: General    Aldrete Phase I:      Aldrete Phase II: Aldrete Score: 10    Anesthesia Post Evaluation    Patient location during evaluation: bedside  Level of consciousness: awake  Pain score: 0  Airway patency: patent  Nausea & Vomiting: no nausea  Cardiovascular status: blood pressure returned to baseline  Respiratory status: acceptable  Hydration status: euvolemic  Pain management: adequate    No notable events documented.

## 2023-03-18 NOTE — H&P (Signed)
 Colonoscopy Pre Procedural Patient History and Evaluation       Patient:  Tracy Middleton  Date of Birth:  [birth year]  Age: 78 y.o.  Sex: female  PCP: Serena Croissant  MRN: [id number]    Procedure: Colonoscopy    Indication: Family history of colon cancer    Year of Previous Colonoscopy: 2019    No Known Allergies    Current Medications:  @PTAMEDSBSHSI @  No current facility-administered medications for this encounter.    Current Outpatient Medications:     sertraline (ZOLOFT) 100 MG tablet, Take 1 tablet by mouth daily, Disp: , Rfl:     prednisoLONE acetate (PRED FORTE) 1 % ophthalmic suspension, INSTILL 1 DROP INTO LEFT EYE 4 TIMES A DAY STARTING 3 DAYS PRIOR TO PROCEDURE (Patient not taking: Reported on 01/27/2023), Disp: , Rfl:     omeprazole (PRILOSEC) 40 MG delayed release capsule, TAKE 1 CAPSULE BY MOUTH ONCE A DAY, 20-30 MINUTES BEFORE SUPPER (Patient not taking: Reported on 01/27/2023), Disp: , Rfl:     Multiple Vitamins-Minerals (AIRBORNE) CHEW, Take by mouth daily (Patient not taking: Reported on 01/27/2023), Disp: , Rfl:     ketorolac (ACULAR) 0.5 % ophthalmic solution, INTILL 1 DROP INTO LEFT EYE TWICE A DAY STARTING 3 DAYS PRIOR TO SURGERY (Patient not taking: Reported on 01/27/2023), Disp: , Rfl:     Glucosamine Sulfate 1000 MG TABS, Take by mouth daily (Patient not taking: Reported on 01/27/2023), Disp: , Rfl:     gabapentin (NEURONTIN) 300 MG capsule, Take 1 capsule 3 times a day by oral route., Disp: , Rfl:     fezolinetant (VEOZAH) 45 MG TABS, Take 1 tablet by mouth daily (Patient not taking: Reported on 01/27/2023), Disp: , Rfl:     cetirizine (ZYRTEC) 10 MG tablet, Take 1 tablet by mouth daily, Disp: , Rfl:     baclofen (LIORESAL) 10 MG tablet, Take 1 tablet by mouth 3 times daily as needed, Disp: , Rfl:     aspirin 81 MG EC tablet, Take 1 tablet by mouth daily (Patient not taking: Reported on 01/27/2023), Disp: , Rfl:     alendronate (FOSAMAX) 70 MG tablet, PLEASE SEE ATTACHED FOR DETAILED DIRECTIONS,  Disp: , Rfl:   Home medications were reviewed with patient: YES    History:  Past Medical History:   Diagnosis Date    Arthritis     Hyperlipidemia      Past Surgical History:   Procedure Laterality Date    COLONOSCOPY      ENDOSCOPY, COLON, DIAGNOSTIC      FRACTURE SURGERY      JOINT REPLACEMENT       History reviewed. No pertinent family history.  Social History     Socioeconomic History    Marital status: Married     Spouse name: Not on file    Number of children: Not on file    Years of education: Not on file    Highest education level: Not on file   Occupational History    Not on file   Tobacco Use    Smoking status: Never    Smokeless tobacco: Never   Vaping Use    Vaping status: Never Used   Substance and Sexual Activity    Alcohol use: Yes     Alcohol/week: 1.0 standard drink of alcohol     Types: 1 Glasses of wine per week    Drug use: Never    Sexual activity: Not on file   Other  Topics Concern    Not on file   Social History Narrative    Not on file     Social Determinants of Health     Financial Resource Strain: Not on file   Food Insecurity: Not on file   Transportation Needs: Not on file   Physical Activity: Not on file   Stress: Not on file   Social Connections: Not on file   Intimate Partner Violence: Not on file   Housing Stability: Not on file       Physical Exam:  There were no vitals taken for this visit.  Heart:  RRR normal S1, S2  Lungs: Clear  Abdomen: soft, nontender, normal bowel sounds  Mental Status:  awake and alert; oriented to person, place, and time  Other physical exam comments:     Impression:  Patient is an appropriate candidate for sedation.    Plan:  1. Proceed to scheduled procedure.  2. Consent has been obtained.       Jaynee Eagles. Marilynn Rail, MD  [facility] / [facility]:  [phone]  Cell 9-5, M-F:  [phone]    March 18, 2023 7:06 AM

## 2023-11-25 ENCOUNTER — Encounter

## 2023-11-28 ENCOUNTER — Inpatient Hospital Stay: Admit: 2023-11-28 | Payer: MEDICARE | Primary: Family Medicine

## 2023-11-28 LAB — POCT CREATININE - BLOOD: Creatinine: 0.6 mg/dL (ref 0.6–1.3)

## 2023-11-28 MED ORDER — IOPAMIDOL 61 % IV SOLN
61 | Freq: Once | INTRAVENOUS | Status: AC | PRN
Start: 2023-11-28 — End: 2023-11-28
  Administered 2023-11-28: 21:00:00 85 mL via INTRAVENOUS

## 2023-11-28 MED FILL — ISOVUE-300 61 % IV SOLN: 61 % | INTRAVENOUS | Qty: 85 | Fill #0

## 2024-02-13 ENCOUNTER — Inpatient Hospital Stay: Admit: 2024-02-13 | Payer: MEDICARE | Primary: Family Medicine

## 2024-02-13 ENCOUNTER — Encounter

## 2024-02-16 ENCOUNTER — Encounter

## 2024-02-16 ENCOUNTER — Inpatient Hospital Stay: Admit: 2024-02-16 | Payer: MEDICARE | Primary: Family Medicine
